# Patient Record
Sex: Female | Born: 1990 | Race: White | Hispanic: No | Marital: Single | Smoking: Never smoker
Health system: Southern US, Community
[De-identification: ages and names within clinical notes are randomized; demographics above are authoritative.]

## PROBLEM LIST (undated history)

## (undated) DIAGNOSIS — Q796 Ehlers-Danlos syndrome, unspecified: Secondary | ICD-10-CM

## (undated) DIAGNOSIS — K219 Gastro-esophageal reflux disease without esophagitis: Secondary | ICD-10-CM

## (undated) DIAGNOSIS — J939 Pneumothorax, unspecified: Secondary | ICD-10-CM

## (undated) DIAGNOSIS — N189 Chronic kidney disease, unspecified: Secondary | ICD-10-CM

## (undated) DIAGNOSIS — J9383 Other pneumothorax: Secondary | ICD-10-CM

## (undated) HISTORY — PX: RENAL BIOPSY, PERCUTANEOUS: SUR144

---

## 2011-07-28 DIAGNOSIS — Q7962 Hypermobile Ehlers-Danlos syndrome: Secondary | ICD-10-CM | POA: Insufficient documentation

## 2011-07-28 DIAGNOSIS — M249 Joint derangement, unspecified: Secondary | ICD-10-CM | POA: Insufficient documentation

## 2012-07-21 DIAGNOSIS — N032 Chronic nephritic syndrome with diffuse membranous glomerulonephritis: Secondary | ICD-10-CM | POA: Insufficient documentation

## 2012-09-06 ENCOUNTER — Emergency Department (HOSPITAL_COMMUNITY): Payer: BC Managed Care – PPO

## 2012-09-06 ENCOUNTER — Inpatient Hospital Stay (HOSPITAL_COMMUNITY): Payer: BC Managed Care – PPO

## 2012-09-06 ENCOUNTER — Encounter (HOSPITAL_COMMUNITY): Payer: Self-pay | Admitting: Emergency Medicine

## 2012-09-06 ENCOUNTER — Inpatient Hospital Stay (HOSPITAL_COMMUNITY)
Admission: EM | Admit: 2012-09-06 | Discharge: 2012-09-09 | DRG: 094 | Disposition: A | Discharge status: Home / Self Care | Payer: BC Managed Care – PPO | Attending: Cardiothoracic Surgery | Admitting: Cardiothoracic Surgery

## 2012-09-06 DIAGNOSIS — Q796 Ehlers-Danlos syndrome, unspecified: Secondary | ICD-10-CM

## 2012-09-06 DIAGNOSIS — J9383 Other pneumothorax: Principal | ICD-10-CM | POA: Diagnosis present

## 2012-09-06 DIAGNOSIS — J939 Pneumothorax, unspecified: Secondary | ICD-10-CM

## 2012-09-06 DIAGNOSIS — R809 Proteinuria, unspecified: Secondary | ICD-10-CM | POA: Diagnosis present

## 2012-09-06 DIAGNOSIS — N183 Chronic kidney disease, stage 3 unspecified: Secondary | ICD-10-CM | POA: Diagnosis present

## 2012-09-06 DIAGNOSIS — J9311 Primary spontaneous pneumothorax: Secondary | ICD-10-CM

## 2012-09-06 DIAGNOSIS — J9819 Other pulmonary collapse: Secondary | ICD-10-CM | POA: Diagnosis present

## 2012-09-06 HISTORY — DX: Pneumothorax, unspecified: J93.9

## 2012-09-06 HISTORY — PX: CHEST TUBE INSERTION: SHX231

## 2012-09-06 MED ORDER — HYDROCODONE-ACETAMINOPHEN 5-325 MG PO TABS
1.0000 | ORAL_TABLET | ORAL | Status: DC | PRN
  Administered 2012-09-06 – 2012-09-08 (×5): 1 via ORAL
  Filled 2012-09-06 (×5): qty 1

## 2012-09-06 MED ORDER — SORBITOL 70 % SOLN
30.0000 mL | Freq: Every day | Status: DC | PRN
  Filled 2012-09-06: qty 30

## 2012-09-06 MED ORDER — ACETAMINOPHEN 325 MG PO TABS: 650.0000 mg | ORAL_TABLET | Freq: Four times a day (QID) | ORAL | Status: DC | PRN

## 2012-09-06 MED ORDER — POLYETHYLENE GLYCOL 3350 17 G PO PACK
17.0000 g | PACK | Freq: Every day | ORAL | Status: DC
  Administered 2012-09-06 – 2012-09-08 (×3): 17 g via ORAL
  Filled 2012-09-06 (×5): qty 1

## 2012-09-06 MED ORDER — ONDANSETRON HCL 4 MG/2ML IJ SOLN: 4.0000 mg | Freq: Four times a day (QID) | INTRAMUSCULAR | Status: DC | PRN

## 2012-09-06 MED ORDER — ONDANSETRON HCL 4 MG PO TABS: 4.0000 mg | ORAL_TABLET | Freq: Four times a day (QID) | ORAL | Status: DC | PRN

## 2012-09-06 MED ORDER — THIAMINE HCL 100 MG/ML IJ SOLN
Freq: Once | INTRAVENOUS | Status: AC
  Administered 2012-09-06: 23:00:00 via INTRAVENOUS
  Filled 2012-09-06: qty 1000

## 2012-09-06 MED ORDER — FENTANYL CITRATE 0.05 MG/ML IJ SOLN
25.0000 ug | Freq: Once | INTRAMUSCULAR | Status: AC
  Administered 2012-09-06: 25 ug via INTRAVENOUS

## 2012-09-06 MED ORDER — ENALAPRIL MALEATE 2.5 MG PO TABS
2.5000 mg | ORAL_TABLET | Freq: Every day | ORAL | Status: DC
  Administered 2012-09-06 – 2012-09-08 (×3): 2.5 mg via ORAL
  Filled 2012-09-06 (×4): qty 1

## 2012-09-06 MED ORDER — MIDAZOLAM HCL 2 MG/2ML IJ SOLN
INTRAMUSCULAR | Status: AC
  Administered 2012-09-06: 2 mg
  Filled 2012-09-06: qty 4

## 2012-09-06 MED ORDER — SODIUM CHLORIDE 0.9 % IJ SOLN
3.0000 mL | Freq: Two times a day (BID) | INTRAMUSCULAR | Status: DC
  Administered 2012-09-07 – 2012-09-08 (×3): 3 mL via INTRAVENOUS

## 2012-09-06 MED ORDER — HYDROMORPHONE HCL PF 1 MG/ML IJ SOLN: 1.0000 mg | INTRAMUSCULAR | Status: DC | PRN

## 2012-09-06 MED ORDER — GUAIFENESIN-DM 100-10 MG/5ML PO SYRP: 5.0000 mL | ORAL_SOLUTION | ORAL | Status: DC | PRN

## 2012-09-06 MED ORDER — DOCUSATE SODIUM 100 MG PO CAPS
100.0000 mg | ORAL_CAPSULE | Freq: Two times a day (BID) | ORAL | Status: DC
  Administered 2012-09-08: 100 mg via ORAL
  Filled 2012-09-06 (×6): qty 1

## 2012-09-06 MED ORDER — ALUM & MAG HYDROXIDE-SIMETH 200-200-20 MG/5ML PO SUSP: 30.0000 mL | Freq: Four times a day (QID) | ORAL | Status: DC | PRN

## 2012-09-06 MED ORDER — RANITIDINE HCL 150 MG/10ML PO SYRP
300.0000 mg | ORAL_SOLUTION | Freq: Every day | ORAL | Status: DC
  Administered 2012-09-06: 300 mg via ORAL
  Filled 2012-09-06 (×2): qty 20

## 2012-09-06 MED ORDER — MIDAZOLAM HCL 5 MG/5ML IJ SOLN
4.0000 mg | Freq: Once | INTRAMUSCULAR | Status: AC
  Administered 2012-09-06: 1 mg via INTRAVENOUS

## 2012-09-06 MED ORDER — FENTANYL CITRATE 0.05 MG/ML IJ SOLN
50.0000 ug | Freq: Once | INTRAMUSCULAR | Status: AC
  Administered 2012-09-06: 25 ug via INTRAVENOUS
  Filled 2012-09-06: qty 2

## 2012-09-06 MED ORDER — POLYETHYLENE GLYCOL 3350 17 G PO PACK: 17.0000 g | PACK | Freq: Every day | ORAL | Status: DC

## 2012-09-06 MED ORDER — ASPIRIN EC 81 MG PO TBEC
81.0000 mg | DELAYED_RELEASE_TABLET | Freq: Every day | ORAL | Status: DC
  Administered 2012-09-07 – 2012-09-08 (×2): 81 mg via ORAL
  Filled 2012-09-06 (×3): qty 1

## 2012-09-06 MED ORDER — ACETAMINOPHEN 650 MG RE SUPP: 650.0000 mg | Freq: Four times a day (QID) | RECTAL | Status: DC | PRN

## 2012-09-06 NOTE — ED Notes (Signed)
Portable CXR complete. Dr. Maren Beach has reviewed it at bedside and says it's ok for patient to have something to drink.

## 2012-09-06 NOTE — ED Notes (Signed)
Patient transported to X-ray 

## 2012-09-06 NOTE — ED Notes (Signed)
Patient with 20 fr right chest tube placed by Dr. Maren Beach. Patient tolerated procedure well. Portable CXR ordered for verification of placement.

## 2012-09-06 NOTE — ED Notes (Signed)
TIME OUT PERFORMED AT 1805 for right chest tube placement by Dr. Maren Beach. Patient AAOx4, resp e/u, NAD. Family at bedside.

## 2012-09-06 NOTE — ED Provider Notes (Signed)
I saw this patient with Coral Ceo PA. She reports 40s, a sudden episode of right-sided chest shoulder and back pain yesterday when she got out of bed. His pain is persisted yesterday and today. She was seen in urgent care and then had a chest x-ray showing a pneumothorax. 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 I have a cal lto  the cardiothoracic surgery regarding management she is stable physical exam showed decreased breath sounds on the right side normal pressures normal pulses pulse ox 97% on room air.  Claudean Kinds, MD 09/10/12 1100

## 2012-09-06 NOTE — ED Provider Notes (Signed)
CSN: [id number]     Arrival date & time 09/06/12  1430 History     None    Chief Complaint  Patient presents with  . Cough    HPI  Denise Compton is a 22 year old female who presents to the ED for evaluation of a cough.  Patient states that she has had a cough for the past 2 weeks.  She states her cough was productive initially but has progressed into a dry cough.  She did not cough up any sputum initially and cannot comment on any quality of the sputum.  No hemoptysis.  She states that at 7:30 AM yesterday morning she developed pain on the front of her right chest and right scapula down her upper right back.  She currently has mild middle chest pain only with deep respirations.  She denies any chest pain at rest. She was at work today and became nauseated and had one episode of emesis. She denies any nausea currently.  She went to an urgent care ([facility]) this afternoon and was told she has a "collapsed lung" and was sent to the ED via EMS.  She denies any dyspnea or SOB currently.  She has no history of tobacco use.  She denies any trauma to the chest.  She also has had a sinus infection for the last few weeks, but her rhinorrhea and congestion have improved.  She has been taking sudafed and Claritin for her sinus infection and tramadol and tylenol for her chest pain.  She denies any fever, chills, change in appetite/activity, abdominal pain, headache, dizziness, or lightheadedness.  She denies any sick contacts.  No history of heart problems in the past.      History reviewed. No pertinent past medical history. History reviewed. No pertinent past surgical history. No family history on file. History  Substance Use Topics  . Smoking status: Not on file  . Smokeless tobacco: Not on file  . Alcohol Use: Not on file   OB History   Grav Para Term Preterm Abortions TAB SAB Ect Mult Living                 Review of Systems  Constitutional: Negative for fever, chills, diaphoresis, activity  change, appetite change and fatigue.  HENT: Positive for congestion (improved), rhinorrhea (improved) and sinus pressure (improved). Negative for ear pain, sore throat, neck pain, neck stiffness and voice change.   Eyes: Negative for visual disturbance.  Respiratory: Positive for cough. Negative for apnea, shortness of breath and wheezing.   Cardiovascular: Positive for chest pain (SEE HPI).  Gastrointestinal: Positive for nausea and vomiting. Negative for abdominal pain, diarrhea and constipation.  Genitourinary: Negative for dysuria.  Musculoskeletal: Negative for back pain.  Skin: Negative for rash and wound.  Neurological: Negative for dizziness, light-headedness and headaches.    Allergies  Review of patient's allergies indicates no known allergies.  Home Medications   Current Outpatient Rx  Name  Route  Sig  Dispense  Refill  . Acetaminophen (TYLENOL PO)   Oral   Take 2 capsules by mouth every 6 (six) hours as needed (headaches).         . enalapril (VASOTEC) 2.5 MG tablet   Oral   Take 2.5 mg by mouth at bedtime.         . fluticasone (FLONASE) 50 MCG/ACT nasal spray   Nasal   Place 1 spray into the nose 2 (two) times daily as needed (congestion).         Marland Kitchen  loratadine (CLARITIN) 10 MG tablet   Oral   Take 10 mg by mouth daily as needed for allergies.         . polyethylene glycol (MIRALAX / GLYCOLAX) packet   Oral   Take 17 g by mouth daily.         . pseudoephedrine (SUDAFED) 30 MG tablet   Oral   Take 60 mg by mouth every 4 (four) hours as needed for congestion.         . ranitidine (ZANTAC) 300 MG tablet   Oral   Take 300 mg by mouth at bedtime.         . traMADol (ULTRAM) 50 MG tablet   Oral   Take 50 mg by mouth every 4 (four) hours as needed for pain.         Marland Kitchen tretinoin (RETIN-A) 0.05 % cream   Topical   Apply topically at bedtime.         . triamcinolone cream (KENALOG) 0.1 %   Topical   Apply 1 application topically daily.           BP 149/98  Pulse 86  Temp(Src) 97.9 F (36.6 C) (Oral)  Resp 20  SpO2 100%  Filed Vitals:   09/06/12 1805 09/06/12 1810 09/06/12 1815 09/06/12 1845  BP: 158/106 150/99 159/101 144/111  Pulse: 94 88 107 85  Temp:      TempSrc:      Resp: 42 17 30 19   SpO2: 100% 100% 100% 100%    Physical Exam  Nursing note and vitals reviewed. Constitutional: She is oriented to person, place, and time. She appears well-developed and well-nourished. No distress.  HENT:  Head: Normocephalic and atraumatic.  Right Ear: External ear normal.  Left Ear: External ear normal.  Nose: Nose normal.  Mouth/Throat: Oropharynx is clear and moist. No oropharyngeal exudate.  Eyes: Conjunctivae are normal. Pupils are equal, round, and reactive to light. Right eye exhibits no discharge. Left eye exhibits no discharge.  Neck: Normal range of motion. Neck supple.  Cardiovascular: Normal rate, regular rhythm, normal heart sounds and intact distal pulses.  Exam reveals no gallop and no friction rub.   No murmur heard. Pulmonary/Chest: Effort normal. No respiratory distress. She has no wheezes. She has no rales. She exhibits no tenderness.  Decreased breath sounds on the right throughout   Abdominal: Soft. Bowel sounds are normal. She exhibits no distension and no mass. There is no tenderness. There is no rebound and no guarding.  Musculoskeletal: Normal range of motion. She exhibits no edema and no tenderness.  Neurological: She is alert and oriented to person, place, and time.  Skin: Skin is warm and dry. She is not diaphoretic.    ED Course   Procedures (including critical care time)  Labs Reviewed  CBC  BASIC METABOLIC PANEL   No results found. No diagnosis found.  No results found for this or any previous visit.   DG Chest Portable 1 View (Final result)  Result time: 09/06/12 18:58:16    Final result by Rad Results In Interface (09/06/12 18:58:16)    Narrative:   *RADIOLOGY  REPORT*  Clinical Data: right-sided chest tube. Right pneumothorax.  PORTABLE CHEST - 1 VIEW  Comparison: 09/06/2012.  Findings: Interval placement of right thoracostomy tube. Nearly all of the right pneumothorax has been evacuated. A pleural line is still visible at the apex. Subsegmental atelectasis at the right lung base. The cardiopericardial silhouette appears within normal limits. The left lung  appears clear.  IMPRESSION: Interval placement of right thoracostomy tube with near complete evacuation of the right pneumothorax.   Original Report Authenticated By: Andreas Newport, M.D.             DG Chest 2 View (Final result)  Result time: 09/06/12 15:35:36    Final result by Rad Results In Interface (09/06/12 15:35:36)    Narrative:   *RADIOLOGY REPORT*  Clinical Data: Chest pain, cough  CHEST - 2 VIEW  Comparison: None  Findings: Normal cardiac and mediastinal contours. No consolidative pulmonary opacities. There is a 1 cm nodular density projecting over the right mid hemithorax. There is a large right pneumothorax with mild leftward shift of the mediastinum. Regional skeleton is grossly unremarkable.  IMPRESSION: 1. Large right pneumothorax with mild leftward mediastinal shift.  2. There is a 1 cm nodular density projecting over the mid right hemithorax which may represent nipple shadow. Recommend attention on follow-up imaging with nipple markers in place.  Critical Value/emergent results were called by telephone at the time of interpretation on 09/06/2012 at 330 pm to Dr. Judd Lien, who verbally acknowledged these results.   Original Report Authenticated By: Annia Belt, M.D    MDM  Nikko Quast is a 22 year old female who presents to the ED for evaluation of a cough.  Chest x-ray ordered to further evaluate.  Patient declined pain medications at this time.     Rechecks  3:55 PM = Patient stable doing well 5:11 PM = Patient states she is doing well.   Pulse ox 99%.  Patient stable.   7:20 PM = Patient resting comfortably. Doing well post-procedure.  No questions or concerns.     Consults  4:55 PM = Spoke with cardiothoracic surgery Dr. Maren Beach.  He is coming to insert the chest tube.   5:57 PM = Cardiothoracic surgeon in the room consenting the patient.  Will perform procedure and she will be admitted under his service.     Etiology of symptoms likely due to a pneumothorax.  A chest tube was inserted by Dr. Maren Beach with cardiothoracic surgery in the ED.  Patient remained stable throughout her ED visit.  She will be admitted as an inpatient under cardiothoracic surgery.  Patient was in agreement with admission and plan.    Final impressions: 1. Pneumothorax     Luiz Iron PA-C   This patient was discussed with Dr. Rolland Porter   Jillyn Ledger, PA-C 09/06/12 1928

## 2012-09-06 NOTE — Progress Notes (Signed)
Procedure Note:  Dx- R spont pneumothorax Procedure- R 4F chest tube placed under local, IV sedation Surgeon- Zenaida Niece Trigt Disposition- Admit to unit 2000 Comp- none known

## 2012-09-06 NOTE — H&P (Signed)
[address]       [place] [address]             [phone]        [facility] Medical Record #[id number] Date of Birth: 02-Mar-1990  Referring: No ref. provider found Primary Care: No primary provider on file.  Chief Complaint:    Chief Complaint  Patient presents with  . Cough    patient examined, chest x-ray reviewed in the emergency department    History of Present Illness:     22 year old female nonsmoker presented to the hospital 24 hours a right sided chest pain and cough and was found to have a right 40% spontaneous pneumothorax--first occurrence. No history of trauma heavy lifting  violent coughing or fall. 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 She states she had a 2-D echocardiogram previously which showed no evidence of cardiac or aortic disease. She has stage III chronic kidney failure  Current pneumothorax is temporarily related to her menstrual cycle but previous menstrual cycles have had no similar symptoms..   Current Activity/ Functional Status: Works full-time as an Airline pilot and Tolerates normal activities   Zubrod Score: At the time of surgery this patient's most appropriate activity status/level should be described as: []  Normal activity, no symptoms [x]  Symptoms, fully ambulatory []  Symptoms, in bed less than or equal to 50% of the time []  Symptoms, in bed greater than 50% of the time but less than 100% []  Bedridden []  Moribund  History reviewed. No pertinent past medical history.  History reviewed. No pertinent past surgical history.  History  Smoking status  . Not on file  Smokeless tobacco  . Not on file   History  Alcohol Use: Not on file    History   Social History  . Marital Status: Single    Spouse Name: N/A    Number of Children: N/A  .  Years of Education: N/A   Occupational History  . Not on file.   Social History Main Topics  . Smoking status: Not on file  . Smokeless tobacco: Not on file  . Alcohol Use: Not on file  . Drug Use: Not on file  . Sexually Active: Not on file   Other Topics Concern  . Not on file   Social History Narrative  . No narrative on file    No Known Allergies  No current facility-administered medications for this encounter.   Current Outpatient Prescriptions  Medication Sig Dispense Refill  . Acetaminophen (TYLENOL PO) Take 2 capsules by mouth every 6 (six) hours as needed (headaches).      . enalapril (VASOTEC) 2.5 MG tablet Take 2.5 mg by mouth at bedtime.      . fluticasone (FLONASE) 50 MCG/ACT nasal spray Place 1 spray into the nose 2 (two) times daily as needed (congestion).      Marland Kitchen loratadine (CLARITIN) 10 MG tablet Take 10 mg by mouth daily as needed for allergies.      . polyethylene glycol (MIRALAX / GLYCOLAX) packet Take 17 g by mouth daily.      . pseudoephedrine (SUDAFED) 30 MG tablet Take 60 mg by mouth every 4 (four) hours as needed for congestion.      . ranitidine (ZANTAC) 300 MG tablet Take 300 mg by mouth at bedtime.      Marland Kitchen  traMADol (ULTRAM) 50 MG tablet Take 50 mg by mouth every 4 (four) hours as needed for pain.      Marland Kitchen tretinoin (RETIN-A) 0.05 % cream Apply topically at bedtime.      . triamcinolone cream (KENALOG) 0.1 % Apply 1 application topically daily.         (Not in a hospital admission)  History reviewed. No pertinent family history.   Review of Systems:     Cardiac Review of Systems: Y or N  Chest Pain [ yes   ]  Resting SOB [yes yes   ] Exertional SOB  [  ]  Orthopnea [  ]   Pedal Edema [   ]    Palpitations [no no  ] Syncope  [ no ]   Presyncope [ no  ]  General Review of Systems: [Y] = yes [  ]=no Constitional: recent weight change [  ]; anorexia [  ]; fatigue [  ]; nausea [  ]; night sweats [  ]; fever [  ]; or chills [  ]                                                                Dental: poor dentition[  ]; Last Dentist visit:   Eye : blurred vision [  ]; diplopia [   ]; vision changes [  ];  Amaurosis fugax[  ]; Resp: cough [  ];  wheezing[  ];  hemoptysis[  ]; shortness of breath[  ]; paroxysmal nocturnal dyspnea[  ]; dyspnea on exertion[  ]; or orthopnea[  ];  GI:  gallstones[  ], vomiting[  ];  dysphagia[  ]; melena[  ];  hematochezia [  ]; heartburn[  ];   Hx of  Colonoscopy[  ]; GU: kidney stones [  ]; hematuria[  ];   dysuria [  ];  nocturia[  ];  history of     obstruction [  ]; urinary frequency [  ]             Skin: rash, swelling[  ];, hair loss[  ];  peripheral edema[  ];  or itching[  ]; Musculosketetal: myalgias[  ];  joint swelling[  ];  joint erythema[  ];  joint pain[  ];  back pain[  ];  Heme/Lymph: bruising[  ];  bleeding[  ];  anemia[  ];  Neuro: TIA[  ];  headaches[  ];  stroke[  ];  vertigo[  ];  seizures[  ];   paresthesias[  ];  difficulty walking[  ];  Psych:depression[  ]; anxiety[  ];  Endocrine: diabetes[  ];  thyroid dysfunction[  ];  Immunizations: Flu [  ]; Pneumococcal[  ];  Other:  Physical Exam: BP 159/101  Pulse 107  Temp(Src) 97.9 F (36.6 C) (Oral)  Resp 30  SpO2 100%  LMP 09/06/2012  General appearance 22 year old thin Caucasian female no acute distress- HEENT-normocephalic no JVD no crepitus in the neck Thorax-no deformity no tenderness breath sounds diminished right base Cardiac regular rhythm no murmur or gallop Abdomen soft nontender no organomegaly Extremities warm pink and well perfused nontender no edema Vascular palpable pulses in all extremities Neurologic no focal motor deficit appropriate and responsive--   Diagnostic Studies & Laboratory data:  chest x-ray reviewed showing large right pneumothorax   Recent Radiology Findings:   Dg Chest 2 View  09/06/2012   *RADIOLOGY REPORT*  Clinical Data: Chest pain, cough  CHEST - 2 VIEW  Comparison: None  Findings: Normal  cardiac and mediastinal contours.  No consolidative pulmonary opacities.  There is a 1 cm nodular density projecting over the right mid hemithorax.  There is a large right pneumothorax with mild leftward shift of the mediastinum.  Regional skeleton is grossly unremarkable.  IMPRESSION: 1. Large right pneumothorax with mild leftward mediastinal shift.  2.  There is a 1 cm nodular density projecting over the mid right hemithorax which may represent nipple shadow.  Recommend attention on follow-up imaging with nipple markers in place.  Critical Value/emergent results were called by telephone at the time of interpretation on  09/06/2012  at 330 pm to Dr. Judd Lien, who verbally acknowledged these results.   Original Report Authenticated By: Annia Belt, M.D      Recent Lab Findings: No results found for this basename: WBC, HGB, HCT, PLT, GLUCOSE, CHOL, TRIG, HDL, LDLDIRECT, LDLCALC, ALT, AST, NA, K, CL, CREATININE, BUN, CO2, TSH, INR, GLUF, HGBA1C      Assessment / Plan:     Spontaneous right pneumothorax-40%  20 French chest tube placed in EGD under local percent lidocaine anesthesia and IV conscious monitored sedation.  Post chest tube x-ray shows reexpansion of the lung the tube in good position, minimal airleak N. Pleur-evac   Plan  Admit to unit 2000 for chest tube therapy. If the air leak persists tomorrow will get chest CT without contrast       @ME1 @ 09/06/2012 6:43 PM

## 2012-09-06 NOTE — ED Notes (Signed)
Pt brought to ED by EMS with complains of cough and also pain in her rt side of the lung.

## 2012-09-07 ENCOUNTER — Inpatient Hospital Stay (HOSPITAL_COMMUNITY): Payer: BC Managed Care – PPO

## 2012-09-07 LAB — CBC
HCT: 35.9 % — ABNORMAL LOW (ref 36.0–46.0)
Hemoglobin: 12.7 g/dL (ref 12.0–15.0)
MCH: 30.8 pg (ref 26.0–34.0)
MCHC: 35.4 g/dL (ref 30.0–36.0)
MCV: 87.1 fL (ref 78.0–100.0)
Platelets: 255 10*3/uL (ref 150–400)
RBC: 4.12 MIL/uL (ref 3.87–5.11)
RDW: 12.6 % (ref 11.5–15.5)
WBC: 10.4 10*3/uL (ref 4.0–10.5)

## 2012-09-07 LAB — URINALYSIS, ROUTINE W REFLEX MICROSCOPIC
Bilirubin Urine: NEGATIVE
Glucose, UA: NEGATIVE mg/dL
Ketones, ur: NEGATIVE mg/dL
Leukocytes, UA: NEGATIVE
Nitrite: NEGATIVE
Protein, ur: >300 mg/dL — AB
Specific Gravity, Urine: 1.019 (ref 1.005–1.030)
Urobilinogen, UA: 0.2 mg/dL (ref 0.0–1.0)
pH: 5.5 (ref 5.0–8.0)

## 2012-09-07 LAB — URINE MICROSCOPIC-ADD ON

## 2012-09-07 LAB — BASIC METABOLIC PANEL
BUN: 21 mg/dL (ref 6–23)
CO2: 27 mEq/L (ref 19–32)
Calcium: 8.8 mg/dL (ref 8.4–10.5)
Chloride: 105 mEq/L (ref 96–112)
Creatinine, Ser: 1.58 mg/dL — ABNORMAL HIGH (ref 0.50–1.10)
GFR calc Af Amer: 52 mL/min — ABNORMAL LOW (ref >90)
GFR calc non Af Amer: 45 mL/min — ABNORMAL LOW (ref >90)
Glucose, Bld: 93 mg/dL (ref 70–99)
Potassium: 4.6 mEq/L (ref 3.5–5.1)
Sodium: 139 mEq/L (ref 135–145)

## 2012-09-07 MED ORDER — PANTOPRAZOLE SODIUM 40 MG PO TBEC
40.0000 mg | DELAYED_RELEASE_TABLET | Freq: Every day | ORAL | Status: DC
  Administered 2012-09-07 – 2012-09-08 (×2): 40 mg via ORAL
  Filled 2012-09-07 (×2): qty 1

## 2012-09-07 NOTE — Care Management Note (Unsigned)
    Page 1 of 1   09/07/2012     4:45:47 PM   CARE MANAGEMENT NOTE 09/07/2012  Patient:  Denise Compton, Denise Compton   Account Number:  [id number]  Date Initiated:  09/07/2012  Documentation initiated by:  Kahlan Engebretson  Subjective/Objective Assessment:   PT ADM ON 09/06/12 WITH RT PNEUMOTHORAX.  PTA, PT INDEPENDENT, LIVES WITH MOTHER.     Action/Plan:   WILL FOLLOW FOR DISCHARGE NEEDS AS PT PROGRESSES.   Anticipated DC Date:  09/09/2012   Anticipated DC Plan:  HOME/SELF CARE      DC Planning Services  CM consult      Choice offered to / List presented to:             Status of service:  In process, will continue to follow Medicare Important Message given?   (If response is "NO", the following Medicare IM given date fields will be blank) Date Medicare IM given:   Date Additional Medicare IM given:    Discharge Disposition:    Per UR Regulation:  Reviewed for med. necessity/level of care/duration of stay  If discussed at Long Length of Stay Meetings, dates discussed:    Comments:

## 2012-09-07 NOTE — Progress Notes (Signed)
Utilization Review Completed.Kaladin Noseworthy T8/12/2012  

## 2012-09-07 NOTE — Progress Notes (Addendum)
      [address]       [name] [address]             [phone]            Subjective: Patient without complaints  Objective: Vital signs in last 24 hours: Temp:  [97.9 F (36.6 C)-99.2 F (37.3 C)] 98 F (36.7 C) (08/12 0455) Pulse Rate:  [70-107] 71 (08/12 0455) Cardiac Rhythm:  [-] Normal sinus rhythm (08/11 2030) Resp:  [14-42] 16 (08/12 0455) BP: (128-159)/(77-111) 128/77 mmHg (08/12 0455) SpO2:  [99 %-100 %] 99 % (08/12 0455) Weight:  [53.9 kg (118 lb 13.3 oz)] 53.9 kg (118 lb 13.3 oz) (08/11 2038)     Intake/Output from previous day: 08/11 0701 - 08/12 0700 In: 480 [P.O.:480] Out: -    Physical Exam:  Cardiovascular: RRR Pulmonary: Clear to auscultation bilaterally; no rales, wheezes, or rhonchi. Abdomen: Soft, non tender, bowel sounds present. Extremities: No  lower extremity edema. Wounds: Dressing is clean and dry. Chest Tube: to suction, no air leak  Lab Results: CBC: Recent Labs  09/07/12 0548  WBC 10.4  HGB 12.7  HCT 35.9*  PLT 255   BMET:  Recent Labs  09/07/12 0548  NA 139  K 4.6  CL 105  CO2 27  GLUCOSE 93  BUN 21  CREATININE 1.58*  CALCIUM 8.8    PT/INR: No results found for this basename: LABPROT, INR,  in the last 72 hours ABG:  INR: Will add last result for INR, ABG once components are confirmed Will add last 4 CBG results once components are confirmed  Assessment/Plan:  1. CV - SR 2.  Pulmonary - Chest tube with scant output. There is no air leak. Will likely place to water seal. Check CXR in am   ZIMMERMAN,DONIELLE MPA-C 09/07/2012,7:48 AM  Leave suction until late today Creat 1.6, protein +++ in urine c/w recent dx glomerulonephritis F/u bmet in am and cont IV fluids

## 2012-09-07 NOTE — Progress Notes (Signed)
Brief Nutrition Note:   RD pulled to pt for unintentional weight loss and underweight BMI.  Spoke with pt, recently was on a trip to [place] and did not eat well. Since returning pt appetite has been normal and intake adequate.   Chart reviewed, no nutrition interventions warranted at this time. Please consult as needed.   Clarene Duke RD, LDN Pager [phone] After Hours pager [phone]

## 2012-09-07 NOTE — Op Note (Signed)
NAMESAM, WUNSCHEL               ACCOUNT NO.:  [id number]  MEDICAL RECORD NO.:  [id number]  LOCATION:  2W31C                        FACILITY:  [facility]  PHYSICIAN:  Kerin Perna, M.D.  DATE OF BIRTH:  [birth year]  DATE OF PROCEDURE:  09/06/2012 DATE OF DISCHARGE:                              OPERATIVE REPORT   OPERATION:  Right chest tube placement, 20-French.  PREOPERATIVE DIAGNOSIS:  40% right spontaneous pneumothorax.  POSTOPERATIVE DIAGNOSIS:  40% right spontaneous pneumothorax.  SURGEON:  Kerin Perna, M.D.  ANESTHESIA:  Local 1% lidocaine with IV conscious sedation.  CLINICAL NOTE:  The patient is 22 years old and presented to the emergency department with her first right-sided spontaneous pneumothorax, which was symptomatic with pain and cough for 24 hours. She had no history of trauma or other significant history.  A right posterior kidney biopsy was performed approximately 1 month ago at [facility] for diagnosis of chronic glomerulonephritis.  Chest tube placement was recommended, and I discussed the procedure in detail with both the patient and her mother, and informed consent was obtained.  The alternatives and expected recovery after chest tube placement were discussed as well as potential risks.  OPERATIVE PROCEDURE:  The patient was supine in the emergency department bed, and a proper time-out was performed.  The anterior right chest was prepped and draped as a sterile field.  Lidocaine 1% was infiltrated over the anterior 3rd rib.  A small 1-inch incision was made and further lidocaine was infiltrated down to the intercostal muscles.  A hemostat was used to enter the right pleural space with evacuation of air.  A 20- French chest tube was then inserted into the right pleural space, connected to an underwater seal Pleur-Evac system and secured to the skin with a silk suture.  A sterile dressing was applied.  The Pleur- Evac was placed to  suction.  A sterile dressing was applied.  The patient was given IV fentanyl and Versed while being fully monitored during procedure.  A followup chest x-ray was performed showing resolution of the spontaneous pneumothorax with good chest tube position.     Kerin Perna, M.D.     PV/MEDQ  D:  09/06/2012  T:  09/07/2012  Job:  [id number]

## 2012-09-08 ENCOUNTER — Inpatient Hospital Stay (HOSPITAL_COMMUNITY): Payer: BC Managed Care – PPO

## 2012-09-08 LAB — BASIC METABOLIC PANEL
BUN: 20 mg/dL (ref 6–23)
CO2: 27 mEq/L (ref 19–32)
GFR calc non Af Amer: 52 mL/min — ABNORMAL LOW (ref >90)
Glucose, Bld: 93 mg/dL (ref 70–99)
Potassium: 3.9 mEq/L (ref 3.5–5.1)
Sodium: 138 mEq/L (ref 135–145)

## 2012-09-08 MED ORDER — TRAMADOL HCL 50 MG PO TABS: 50.0000 mg | ORAL_TABLET | ORAL | Status: DC | PRN

## 2012-09-08 MED ORDER — POLYETHYLENE GLYCOL 3350 17 G PO PACK: 17.0000 g | PACK | Freq: Every day | ORAL | Status: DC

## 2012-09-08 NOTE — Progress Notes (Signed)
CT pulled per order. Pt tolerated procedure well. Occlusive dsg applied and sutures tightly tied.  Pt instructed to inform RN if SOB occurs. CXR to be obtained in AM.  Will continue to monitor pt closely.

## 2012-09-08 NOTE — Discharge Summary (Signed)
Physician Discharge Summary       [address]       [name] [address]             [phone]    Patient ID: Denise Compton MRN: [id number] DOB/AGE: 1990/07/05 22 y.o.  Admit date: 09/06/2012 Discharge date: 09/09/2012  Admission Diagnoses: 1.Spontaneous right pneumothorax 2.History of Ehlers-Danlos syndrome 3.History of stage III CKD  Discharge Diagnoses:  1.Spontaneous right pneumothorax 2.History of Ehlers-Danlos syndrome 3.History of stage III CKD  Procedure (s):  Right chest tube placement, 20-French by Dr. Donata Clay on 09/06/2012.  History of Presenting Illness: This is a 22 year old female nonsmoker who presented to [facility] with complaints of right sided chest pain and cough. Chest x ray showed a right 40% spontaneous pneumothorax. This was her first occurrence. She denied any history of trauma, heavy lifting, violent coughing or fall. 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 She was placed on the enalapril 2.5 mg daily. She states she had a 2-D echocardiogram previously which showed no evidence of cardiac or aortic disease. In addition, she has stage III chronic kidney failure. Current pneumothorax is temporarily related to her menstrual cycle, but previous menstrual cycles have had no similar symptoms. Dr. Donata Clay placed a right chest tube. Follow up chest xray showed re expansion of the right lung.  Brief Hospital Course:  She has remained afebrile and hemodynamically stable. Her chest tube had scant output, no air leak, and chest x rays remained stable. Chest tube was placed to water seal on 8/12. Chest x ray this morning showed a stable, trace right apical pneumothorax. Chest tube will be removed. Provided she remains afebrile, chest x ray remains stable, and pending morning round evaluation, she will be surgically stable for discharge  on 09/09/2012.   Latest Vital Signs: Blood pressure 115/70, pulse 65, temperature 98.4 F (36.9 C), temperature source Oral, resp. rate 17, height 5\' 9"  (1.753 m), weight 53.797 kg (118 lb 9.6 oz), last menstrual period 09/05/2012, SpO2 97.00%.  Physical Exam: Cardiovascular: RRR  Pulmonary: Clear to auscultation bilaterally; no rales, wheezes, or rhonchi.  Abdomen: Soft, non tender, bowel sounds present.  Extremities: No lower extremity edema.  Wound: Dressing is clean and dry.     Discharge Condition:Stable  Recent laboratory studies:  Lab Results  Component Value Date   WBC 10.4 09/07/2012   HGB 12.7 09/07/2012   HCT 35.9* 09/07/2012   MCV 87.1 09/07/2012   PLT 255 09/07/2012   Lab Results  Component Value Date   NA 138 09/08/2012   K 3.9 09/08/2012   CL 104 09/08/2012   CO2 27 09/08/2012   CREATININE 1.41* 09/08/2012   GLUCOSE 93 09/08/2012      Diagnostic Studies: Dg Chest 2 View  09/06/2012   *RADIOLOGY REPORT*  Clinical Data: Chest pain, cough  CHEST - 2 VIEW  Comparison: None  Findings: Normal cardiac and mediastinal contours.  No consolidative pulmonary opacities.  There is a 1 cm nodular density projecting over the right mid hemithorax.  There is a large right pneumothorax with mild leftward shift of the mediastinum.  Regional skeleton is grossly unremarkable.  IMPRESSION: 1. Large right pneumothorax with mild leftward mediastinal shift.  2.  There is a 1 cm nodular density projecting over the mid right hemithorax which may represent nipple shadow.  Recommend attention on follow-up imaging with nipple markers in  place.  Critical Value/emergent results were called by telephone at the time of interpretation on  09/06/2012  at 330 pm to Dr. Judd Lien, who verbally acknowledged these results.   Original Report Authenticated By: Annia Belt, M.D   09/09/2012 CHEST - 2 VIEW  Comparison: September 08, 2012.  Findings: Cardiomediastinal silhouette appears normal. There has  been interval  removal of right-sided chest tube. No definite  pneumothorax is seen. No acute pulmonary disease is noted.  Hyperexpansion of the lungs is noted.  IMPRESSION:  No pneumothorax seen.  Original Report Authenticated By: Lupita Raider., M.D.        Future Appointments Provider Department Dept Phone   09/22/2012 12:00 PM Kerin Perna, MD [facility] [phone]     Discharge Medications:   Medication List         enalapril 2.5 MG tablet  Commonly known as:  VASOTEC  Take 2.5 mg by mouth at bedtime.     fluticasone 50 MCG/ACT nasal spray  Commonly known as:  FLONASE  Place 1 spray into the nose 2 (two) times daily as needed (congestion).     loratadine 10 MG tablet  Commonly known as:  CLARITIN  Take 10 mg by mouth daily as needed for allergies.     polyethylene glycol packet  Commonly known as:  MIRALAX / GLYCOLAX  Take 17 g by mouth daily.     pseudoephedrine 30 MG tablet  Commonly known as:  SUDAFED  Take 60 mg by mouth every 4 (four) hours as needed for congestion.     ranitidine 300 MG tablet  Commonly known as:  ZANTAC  Take 300 mg by mouth at bedtime.     traMADol 50 MG tablet  Commonly known as:  ULTRAM  Take 1 tablet (50 mg total) by mouth every 4 (four) hours as needed for pain.     tretinoin 0.05 % cream  Commonly known as:  RETIN-A  Apply topically at bedtime.     triamcinolone cream 0.1 %  Commonly known as:  KENALOG  Apply 1 application topically daily.     TYLENOL PO  Take 2 capsules by mouth every 6 (six) hours as needed (headaches).        Follow Up Appointments: Follow-up Information   Follow up with VAN Dinah Beers, MD. (PA/LAT CXR to be taken (at [facility] which is in the same building as Dr. Zenaida Niece Trigt's office) on 09/22/2012 at 11:00 am;Appointment with Dr. Donata Clay is on 09/22/2012 at 12:00 pm)    Specialty:  Cardiothoracic Surgery   Contact information:   [address] [place] [address] [phone]       Signed: Doree Fudge MPA-C 09/09/2012, 8:18 AM

## 2012-09-08 NOTE — Progress Notes (Addendum)
      [address]       [name] [address]             [phone]            Subjective: Patient without complaints  Objective: Vital signs in last 24 hours: Temp:  [97.5 F (36.4 C)-98.5 F (36.9 C)] 97.5 F (36.4 C) (08/13 0548) Pulse Rate:  [61-79] 61 (08/13 0548) Cardiac Rhythm:  [-] Normal sinus rhythm (08/13 0808) Resp:  [18] 18 (08/13 0548) BP: (107-132)/(73-83) 107/73 mmHg (08/13 0548) SpO2:  [100 %] 100 % (08/13 0548) Weight:  [53.4 kg (117 lb 11.6 oz)] 53.4 kg (117 lb 11.6 oz) (08/13 0548)     Intake/Output from previous day: 08/12 0701 - 08/13 0700 In: 720 [P.O.:720] Out: 1060 [Urine:1050; Drains:10]   Physical Exam:  Cardiovascular: RRR Pulmonary: Clear to auscultation bilaterally; no rales, wheezes, or rhonchi. Abdomen: Soft, non tender, bowel sounds present. Extremities: No  lower extremity edema. Wounds: Dressing is clean and dry. Chest Tube: to water seal, no air leak  Lab Results: CBC:  Recent Labs  09/07/12 0548  WBC 10.4  HGB 12.7  HCT 35.9*  PLT 255   BMET:   Recent Labs  09/07/12 0548 09/08/12 0445  NA 139 138  K 4.6 3.9  CL 105 104  CO2 27 27  GLUCOSE 93 93  BUN 21 20  CREATININE 1.58* 1.41*  CALCIUM 8.8 8.8    PT/INR: No results found for this basename: LABPROT, INR,  in the last 72 hours ABG:  INR: Will add last result for INR, ABG once components are confirmed Will add last 4 CBG results once components are confirmed  Assessment/Plan:  1. CV - SR 2.  Pulmonary - Chest tube with scant output. Chest tube is to water seal and there is no air leak.  CXR shows trace, stable right apical pneumothorax. Likely remove chest tube today. Check CXR in am 3.Large amount of protein in urine. Creatinine now down to 1.41. 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 4.Likely discharge in am, if CXR stable  ZIMMERMAN,DONIELLE MPA-C 09/08/2012,8:11 AM  Remove  chest tube 2 view chest x-ray in a.m. patient examined and medical record reviewed,agree with above note. VAN TRIGT III,Malessa Zartman 09/08/2012

## 2012-09-09 ENCOUNTER — Inpatient Hospital Stay (HOSPITAL_COMMUNITY): Payer: BC Managed Care – PPO

## 2012-09-09 NOTE — Progress Notes (Signed)
Discharged to home with family office visits in place teaching done  

## 2012-09-09 NOTE — Progress Notes (Addendum)
      [address]       [name] [address]             [phone]            Subjective: Patient without complaints-eating breakfast.  Objective: Vital signs in last 24 hours: Temp:  [98.2 F (36.8 C)-98.5 F (36.9 C)] 98.4 F (36.9 C) (08/14 0418) Pulse Rate:  [65-73] 65 (08/14 0418) Cardiac Rhythm:  [-] Normal sinus rhythm (08/13 2010) Resp:  [16-18] 17 (08/14 0418) BP: (115-123)/(70-81) 115/70 mmHg (08/14 0418) SpO2:  [97 %-100 %] 97 % (08/14 0418) Weight:  [53.797 kg (118 lb 9.6 oz)] 53.797 kg (118 lb 9.6 oz) (08/14 0418)     Intake/Output from previous day: 08/13 0701 - 08/14 0700 In: 723 [P.O.:720; I.V.:3] Out: 1000 [Urine:1000]   Physical Exam:  Cardiovascular: RRR Pulmonary: Clear to auscultation bilaterally; no rales, wheezes, or rhonchi. Abdomen: Soft, non tender, bowel sounds present. Extremities: No  lower extremity edema. Wounds: Dressing is clean and dry. Chest Tubeout  Lab Results: CBC:  Recent Labs  09/07/12 0548  WBC 10.4  HGB 12.7  HCT 35.9*  PLT 255   BMET:   Recent Labs  09/07/12 0548 09/08/12 0445  NA 139 138  K 4.6 3.9  CL 105 104  CO2 27 27  GLUCOSE 93 93  BUN 21 20  CREATININE 1.58* 1.41*  CALCIUM 8.8 8.8    PT/INR: No results found for this basename: LABPROT, INR,  in the last 72 hours ABG:  INR: Will add last result for INR, ABG once components are confirmed Will add last 4 CBG results once components are confirmed  Assessment/Plan:  1. CV - SR 2.  Pulmonary - Chest tube removed yesterday.  CXR shows probable trace, stable right apical pneumothorax.  3.Large amount of protein in urine. Creatinine now down to 1.41. 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 4.Discharge  ZIMMERMAN,DONIELLE MPA-C 09/09/2012,7:31 AM   Agree with plan to DC CXR and patient examined Office next WED with CXR

## 2012-09-09 NOTE — Discharge Summary (Signed)
patient examined and medical record reviewed,agree with above note. VAN TRIGT III,Khamani Fairley 09/09/2012   

## 2012-09-10 NOTE — ED Provider Notes (Signed)
Medical screening examination/treatment/procedure(s) were performed by non-physician practitioner and as supervising physician I was immediately available for consultation/collaboration.   Jocie Meroney Joseph Burney Calzadilla, MD 09/10/12 1105 

## 2012-09-12 ENCOUNTER — Encounter (HOSPITAL_COMMUNITY): Payer: Self-pay | Admitting: *Deleted

## 2012-09-12 ENCOUNTER — Encounter (HOSPITAL_COMMUNITY)
Admission: EM | Disposition: A | Discharge status: Home / Self Care | Payer: Self-pay | Source: Home / Self Care | Attending: Thoracic Surgery (Cardiothoracic Vascular Surgery)

## 2012-09-12 ENCOUNTER — Inpatient Hospital Stay (HOSPITAL_COMMUNITY): Payer: BC Managed Care – PPO

## 2012-09-12 ENCOUNTER — Inpatient Hospital Stay (HOSPITAL_COMMUNITY)
Admission: EM | Admit: 2012-09-12 | Discharge: 2012-09-16 | DRG: 075 | Disposition: A | Discharge status: Home / Self Care | Payer: BC Managed Care – PPO | Attending: Thoracic Surgery (Cardiothoracic Vascular Surgery) | Admitting: Thoracic Surgery (Cardiothoracic Vascular Surgery)

## 2012-09-12 ENCOUNTER — Emergency Department (HOSPITAL_COMMUNITY): Payer: BC Managed Care – PPO

## 2012-09-12 ENCOUNTER — Encounter (HOSPITAL_COMMUNITY): Payer: Self-pay | Admitting: Anesthesiology

## 2012-09-12 ENCOUNTER — Emergency Department (HOSPITAL_COMMUNITY): Payer: BC Managed Care – PPO | Admitting: Anesthesiology

## 2012-09-12 DIAGNOSIS — Z79899 Other long term (current) drug therapy: Secondary | ICD-10-CM

## 2012-09-12 DIAGNOSIS — N189 Chronic kidney disease, unspecified: Secondary | ICD-10-CM | POA: Insufficient documentation

## 2012-09-12 DIAGNOSIS — K219 Gastro-esophageal reflux disease without esophagitis: Secondary | ICD-10-CM

## 2012-09-12 DIAGNOSIS — J930 Spontaneous tension pneumothorax: Secondary | ICD-10-CM

## 2012-09-12 DIAGNOSIS — Q796 Ehlers-Danlos syndrome, unspecified: Secondary | ICD-10-CM

## 2012-09-12 DIAGNOSIS — J939 Pneumothorax, unspecified: Secondary | ICD-10-CM

## 2012-09-12 DIAGNOSIS — J9383 Other pneumothorax: Secondary | ICD-10-CM

## 2012-09-12 HISTORY — DX: Chronic kidney disease, unspecified: N18.9

## 2012-09-12 HISTORY — DX: Ehlers-Danlos syndrome, unspecified: Q79.60

## 2012-09-12 HISTORY — DX: Other pneumothorax: J93.83

## 2012-09-12 HISTORY — DX: Pneumothorax, unspecified: J93.9

## 2012-09-12 HISTORY — DX: Gastro-esophageal reflux disease without esophagitis: K21.9

## 2012-09-12 HISTORY — PX: VIDEO ASSISTED THORACOSCOPY: SHX5073

## 2012-09-12 LAB — TYPE AND SCREEN
ABO/RH(D): A POS
Antibody Screen: NEGATIVE

## 2012-09-12 LAB — CBC WITH DIFFERENTIAL/PLATELET
Eosinophils Absolute: 0.2 10*3/uL (ref 0.0–0.7)
HCT: 40.8 % (ref 36.0–46.0)
Hemoglobin: 14.6 g/dL (ref 12.0–15.0)
Lymphs Abs: 1.4 10*3/uL (ref 0.7–4.0)
MCH: 30.4 pg (ref 26.0–34.0)
MCHC: 35.8 g/dL (ref 30.0–36.0)
MCV: 85 fL (ref 78.0–100.0)
Monocytes Absolute: 0.3 10*3/uL (ref 0.1–1.0)
Monocytes Relative: 5 % (ref 3–12)
Neutrophils Relative %: 70 % (ref 43–77)
RBC: 4.8 MIL/uL (ref 3.87–5.11)

## 2012-09-12 LAB — POCT I-STAT, CHEM 8
Calcium, Ion: 1.18 mmol/L (ref 1.12–1.23)
Creatinine, Ser: 1.5 mg/dL — ABNORMAL HIGH (ref 0.50–1.10)
Glucose, Bld: 96 mg/dL (ref 70–99)
Hemoglobin: 14.3 g/dL (ref 12.0–15.0)
Potassium: 4.6 mEq/L (ref 3.5–5.1)

## 2012-09-12 LAB — ABO/RH: ABO/RH(D): A POS

## 2012-09-12 LAB — PROTIME-INR: INR: 1.03 (ref 0.00–1.49)

## 2012-09-12 SURGERY — VIDEO ASSISTED THORACOSCOPY
Anesthesia: General | Site: Chest | Laterality: Right | Wound class: Clean Contaminated

## 2012-09-12 MED ORDER — ONDANSETRON HCL 4 MG/2ML IJ SOLN
4.0000 mg | Freq: Four times a day (QID) | INTRAMUSCULAR | Status: DC | PRN
Start: 2012-09-12 — End: 2012-09-12

## 2012-09-12 MED ORDER — OXYCODONE-ACETAMINOPHEN 5-325 MG PO TABS
1.0000 | ORAL_TABLET | ORAL | Status: DC | PRN
  Administered 2012-09-13 – 2012-09-14 (×3): 1 via ORAL
  Administered 2012-09-15: 2 via ORAL
  Administered 2012-09-15 – 2012-09-16 (×2): 1 via ORAL
  Filled 2012-09-12 (×2): qty 1
  Filled 2012-09-12 (×2): qty 2
  Filled 2012-09-12 (×2): qty 1

## 2012-09-12 MED ORDER — BISACODYL 5 MG PO TBEC
10.0000 mg | DELAYED_RELEASE_TABLET | Freq: Every day | ORAL | Status: DC
  Administered 2012-09-13 – 2012-09-15 (×3): 10 mg via ORAL
  Filled 2012-09-12 (×3): qty 2

## 2012-09-12 MED ORDER — DEXTROSE-NACL 5-0.45 % IV SOLN
INTRAVENOUS | Status: DC
  Administered 2012-09-12: 75 mL/h via INTRAVENOUS
  Administered 2012-09-14: via INTRAVENOUS

## 2012-09-12 MED ORDER — FENTANYL 10 MCG/ML IV SOLN
INTRAVENOUS | Status: DC
  Administered 2012-09-12: 16:00:00 via INTRAVENOUS
  Administered 2012-09-12: 255 ug via INTRAVENOUS
  Administered 2012-09-13: 101.5 ug via INTRAVENOUS
  Administered 2012-09-13: 60 ug via INTRAVENOUS
  Administered 2012-09-13: 120 ug via INTRAVENOUS
  Administered 2012-09-13 (×2): 105 ug via INTRAVENOUS
  Administered 2012-09-13 – 2012-09-14 (×2): 90 ug via INTRAVENOUS
  Administered 2012-09-14: 135 ug via INTRAVENOUS
  Administered 2012-09-14: 15 ug via INTRAVENOUS
  Administered 2012-09-14: 1.05 ug via INTRAVENOUS
  Administered 2012-09-14: 65 ug via INTRAVENOUS
  Administered 2012-09-14: 133.2 ug via INTRAVENOUS
  Administered 2012-09-14: 22:00:00 via INTRAVENOUS
  Administered 2012-09-15: 135 ug via INTRAVENOUS
  Administered 2012-09-15: 30 ug via INTRAVENOUS
  Filled 2012-09-12 (×4): qty 50

## 2012-09-12 MED ORDER — ACETAMINOPHEN 160 MG/5ML PO SOLN
1000.0000 mg | Freq: Four times a day (QID) | ORAL | Status: AC
  Administered 2012-09-12: 1000 mg via ORAL
  Filled 2012-09-12: qty 40.6

## 2012-09-12 MED ORDER — SUCCINYLCHOLINE CHLORIDE 20 MG/ML IJ SOLN
INTRAMUSCULAR | Status: DC | PRN
  Administered 2012-09-12: 100 mg via INTRAVENOUS

## 2012-09-12 MED ORDER — TRIAMCINOLONE ACETONIDE 0.1 % EX CREA
1.0000 | TOPICAL_CREAM | Freq: Every day | CUTANEOUS | Status: DC
Start: 2012-09-13 — End: 2012-09-16
  Filled 2012-09-12: qty 15

## 2012-09-12 MED ORDER — MIDAZOLAM HCL 5 MG/5ML IJ SOLN
INTRAMUSCULAR | Status: DC | PRN
  Administered 2012-09-12: 2 mg via INTRAVENOUS

## 2012-09-12 MED ORDER — FAMOTIDINE 40 MG PO TABS
40.0000 mg | ORAL_TABLET | Freq: Every day | ORAL | Status: DC
  Administered 2012-09-12 – 2012-09-15 (×4): 40 mg via ORAL
  Filled 2012-09-12 (×6): qty 1

## 2012-09-12 MED ORDER — ROCURONIUM BROMIDE 100 MG/10ML IV SOLN
INTRAVENOUS | Status: DC | PRN
  Administered 2012-09-12: 50 mg via INTRAVENOUS

## 2012-09-12 MED ORDER — SODIUM CHLORIDE 0.9 % IJ SOLN: 9.0000 mL | INTRAMUSCULAR | Status: DC | PRN

## 2012-09-12 MED ORDER — DIPHENHYDRAMINE HCL 50 MG/ML IJ SOLN: 12.5000 mg | Freq: Four times a day (QID) | INTRAMUSCULAR | Status: DC | PRN

## 2012-09-12 MED ORDER — BUPIVACAINE HCL (PF) 0.5 % IJ SOLN
INTRAMUSCULAR | Status: AC
  Filled 2012-09-12: qty 30

## 2012-09-12 MED ORDER — ACETAMINOPHEN 160 MG/5ML PO SOLN
1000.0000 mg | Freq: Four times a day (QID) | ORAL | Status: DC
  Filled 2012-09-12 (×3): qty 40

## 2012-09-12 MED ORDER — BUPIVACAINE HCL 0.5 % IJ SOLN
INTRAMUSCULAR | Status: DC | PRN
  Administered 2012-09-12: 30 mL

## 2012-09-12 MED ORDER — LACTATED RINGERS IV SOLN
INTRAVENOUS | Status: DC | PRN
  Administered 2012-09-12: 12:00:00 via INTRAVENOUS

## 2012-09-12 MED ORDER — ACETAMINOPHEN 500 MG PO TABS
1000.0000 mg | ORAL_TABLET | Freq: Four times a day (QID) | ORAL | Status: AC
  Administered 2012-09-13 (×2): 1000 mg via ORAL
  Filled 2012-09-12 (×4): qty 2

## 2012-09-12 MED ORDER — GLYCOPYRROLATE 0.2 MG/ML IJ SOLN
INTRAMUSCULAR | Status: DC | PRN
  Administered 2012-09-12: 0.2 mg via INTRAVENOUS
  Administered 2012-09-12: 0.4 mg via INTRAVENOUS

## 2012-09-12 MED ORDER — OXYCODONE HCL 5 MG/5ML PO SOLN: 5.0000 mg | Freq: Once | ORAL | Status: DC | PRN

## 2012-09-12 MED ORDER — NEOSTIGMINE METHYLSULFATE 1 MG/ML IJ SOLN
INTRAMUSCULAR | Status: DC | PRN
  Administered 2012-09-12: 3 mg via INTRAVENOUS
  Administered 2012-09-12: 1 mg via INTRAVENOUS

## 2012-09-12 MED ORDER — LIDOCAINE HCL (CARDIAC) 20 MG/ML IV SOLN
INTRAVENOUS | Status: DC | PRN
  Administered 2012-09-12: 80 mg via INTRAVENOUS

## 2012-09-12 MED ORDER — PROMETHAZINE HCL 25 MG/ML IJ SOLN: 6.2500 mg | INTRAMUSCULAR | Status: DC | PRN

## 2012-09-12 MED ORDER — HYDROMORPHONE HCL PF 1 MG/ML IJ SOLN
INTRAMUSCULAR | Status: AC
  Administered 2012-09-12: 0.5 mg via INTRAVENOUS
  Filled 2012-09-12: qty 2

## 2012-09-12 MED ORDER — OXYCODONE HCL 5 MG PO TABS: 5.0000 mg | ORAL_TABLET | Freq: Once | ORAL | Status: DC | PRN

## 2012-09-12 MED ORDER — NALOXONE HCL 0.4 MG/ML IJ SOLN: 0.4000 mg | INTRAMUSCULAR | Status: DC | PRN

## 2012-09-12 MED ORDER — FENTANYL CITRATE 0.05 MG/ML IJ SOLN
INTRAMUSCULAR | Status: DC | PRN
  Administered 2012-09-12: 50 ug via INTRAVENOUS
  Administered 2012-09-12 (×3): 100 ug via INTRAVENOUS

## 2012-09-12 MED ORDER — TRETINOIN 0.05 % EX CREA: TOPICAL_CREAM | Freq: Every day | CUTANEOUS | Status: DC

## 2012-09-12 MED ORDER — ENALAPRIL MALEATE 2.5 MG PO TABS
2.5000 mg | ORAL_TABLET | Freq: Every day | ORAL | Status: DC
  Administered 2012-09-12 – 2012-09-15 (×4): 2.5 mg via ORAL
  Filled 2012-09-12 (×5): qty 1

## 2012-09-12 MED ORDER — 0.9 % SODIUM CHLORIDE (POUR BTL) OPTIME
TOPICAL | Status: DC | PRN
  Administered 2012-09-12: 1000 mL

## 2012-09-12 MED ORDER — ONDANSETRON HCL 4 MG/2ML IJ SOLN
4.0000 mg | Freq: Four times a day (QID) | INTRAMUSCULAR | Status: DC | PRN
  Administered 2012-09-13: 4 mg via INTRAVENOUS
  Filled 2012-09-12: qty 2

## 2012-09-12 MED ORDER — ONDANSETRON HCL 4 MG/2ML IJ SOLN
INTRAMUSCULAR | Status: DC | PRN
  Administered 2012-09-12: 4 mg via INTRAVENOUS

## 2012-09-12 MED ORDER — PROPOFOL 10 MG/ML IV BOLUS
INTRAVENOUS | Status: DC | PRN
  Administered 2012-09-12: 40 mg via INTRAVENOUS
  Administered 2012-09-12: 160 mg via INTRAVENOUS

## 2012-09-12 MED ORDER — OXYCODONE HCL 5 MG PO TABS: 5.0000 mg | ORAL_TABLET | ORAL | Status: AC | PRN

## 2012-09-12 MED ORDER — DIPHENHYDRAMINE HCL 12.5 MG/5ML PO ELIX
12.5000 mg | ORAL_SOLUTION | Freq: Four times a day (QID) | ORAL | Status: DC | PRN
  Filled 2012-09-12: qty 5

## 2012-09-12 MED ORDER — HYDROMORPHONE HCL PF 1 MG/ML IJ SOLN
0.2500 mg | INTRAMUSCULAR | Status: DC | PRN
  Administered 2012-09-12 (×3): 0.5 mg via INTRAVENOUS

## 2012-09-12 MED ORDER — DEXTROSE 5 % IV SOLN
1.5000 g | INTRAVENOUS | Status: AC
  Administered 2012-09-12: 1.5 g via INTRAVENOUS
  Filled 2012-09-12: qty 1.5

## 2012-09-12 MED ORDER — DEXTROSE 5 % IV SOLN
1.5000 g | Freq: Two times a day (BID) | INTRAVENOUS | Status: AC
  Administered 2012-09-12 – 2012-09-13 (×2): 1.5 g via INTRAVENOUS
  Filled 2012-09-12 (×3): qty 1.5

## 2012-09-12 MED ORDER — POTASSIUM CHLORIDE 10 MEQ/50ML IV SOLN
10.0000 meq | Freq: Every day | INTRAVENOUS | Status: DC | PRN
  Filled 2012-09-12: qty 50

## 2012-09-12 MED ORDER — POLYETHYLENE GLYCOL 3350 17 G PO PACK
17.0000 g | PACK | Freq: Every day | ORAL | Status: DC
  Administered 2012-09-12 – 2012-09-15 (×4): 17 g via ORAL
  Filled 2012-09-12 (×5): qty 1

## 2012-09-12 MED ORDER — ACETAMINOPHEN 500 MG PO TABS
1000.0000 mg | ORAL_TABLET | Freq: Four times a day (QID) | ORAL | Status: DC
  Filled 2012-09-12 (×3): qty 2

## 2012-09-12 SURGICAL SUPPLY — 64 items
APPLICATOR TIP EXT COSEAL (VASCULAR PRODUCTS) IMPLANT
APPLIER CLIP ROT 10 11.4 M/L (STAPLE)
CANISTER SUCTION 2500CC (MISCELLANEOUS) ×3 IMPLANT
CATH KIT ON Q 5IN SLV (PAIN MANAGEMENT) IMPLANT
CATH THORACIC 28FR (CATHETERS) IMPLANT
CATH THORACIC 36FR (CATHETERS) IMPLANT
CATH THORACIC 36FR RT ANG (CATHETERS) IMPLANT
CLIP APPLIE ROT 10 11.4 M/L (STAPLE) IMPLANT
CLIP TI MEDIUM 6 (CLIP) ×3 IMPLANT
CLOTH BEACON ORANGE TIMEOUT ST (SAFETY) ×3 IMPLANT
CONN ST 1/4X3/8  BEN (MISCELLANEOUS) ×1
CONN ST 1/4X3/8 BEN (MISCELLANEOUS) ×2 IMPLANT
CONT SPEC 4OZ CLIKSEAL STRL BL (MISCELLANEOUS) ×6 IMPLANT
COVER SURGICAL LIGHT HANDLE (MISCELLANEOUS) ×3 IMPLANT
DERMABOND ADVANCED (GAUZE/BANDAGES/DRESSINGS) ×1
DERMABOND ADVANCED .7 DNX12 (GAUZE/BANDAGES/DRESSINGS) ×2 IMPLANT
DRAPE LAPAROSCOPIC ABDOMINAL (DRAPES) ×3 IMPLANT
DRAPE WARM FLUID 44X44 (DRAPE) ×3 IMPLANT
ELECT REM PT RETURN 9FT ADLT (ELECTROSURGICAL) ×3
ELECTRODE REM PT RTRN 9FT ADLT (ELECTROSURGICAL) ×2 IMPLANT
FLUID NSS /IRRIG 3000 ML XXX (IV SOLUTION) IMPLANT
GLOVE BIO SURGEON STRL SZ 6 (GLOVE) ×9 IMPLANT
GLOVE BIO SURGEON STRL SZ 6.5 (GLOVE) ×3 IMPLANT
GLOVE EUDERMIC 7 POWDERFREE (GLOVE) IMPLANT
GLOVE ORTHO TXT STRL SZ7.5 (GLOVE) ×6 IMPLANT
GOWN STRL NON-REIN LRG LVL3 (GOWN DISPOSABLE) ×12 IMPLANT
HANDLE STAPLE ENDO GIA SHORT (STAPLE) ×1
KIT BASIN OR (CUSTOM PROCEDURE TRAY) ×3 IMPLANT
KIT ROOM TURNOVER OR (KITS) ×3 IMPLANT
NEEDLE INTRO FOURFACET TIP 11G (NEEDLE) ×3 IMPLANT
NS IRRIG 1000ML POUR BTL (IV SOLUTION) ×6 IMPLANT
PACK CHEST (CUSTOM PROCEDURE TRAY) ×3 IMPLANT
PAD ARMBOARD 7.5X6 YLW CONV (MISCELLANEOUS) ×6 IMPLANT
RELOAD EGIA 45 MED/THCK PURPLE (STAPLE) ×6 IMPLANT
SEALANT SURG COSEAL 4ML (VASCULAR PRODUCTS) IMPLANT
SEALANT SURG COSEAL 8ML (VASCULAR PRODUCTS) IMPLANT
SET IRRIG TUBING LAPAROSCOPIC (IRRIGATION / IRRIGATOR) IMPLANT
SOLUTION ANTI FOG 6CC (MISCELLANEOUS) ×3 IMPLANT
SPONGE GAUZE 4X4 12PLY (GAUZE/BANDAGES/DRESSINGS) ×3 IMPLANT
STAPLER ENDO GIA 12MM SHORT (STAPLE) ×2 IMPLANT
SUT MNCRL AB 3-0 PS2 18 (SUTURE) ×3 IMPLANT
SUT PROLENE 3 0 SH DA (SUTURE) IMPLANT
SUT PROLENE 4 0 RB 1 (SUTURE)
SUT PROLENE 4-0 RB1 .5 CRCL 36 (SUTURE) IMPLANT
SUT SILK  1 MH (SUTURE) ×2
SUT SILK 1 MH (SUTURE) ×4 IMPLANT
SUT SILK 2 0SH CR/8 30 (SUTURE) IMPLANT
SUT VIC AB 1 CTX 18 (SUTURE) IMPLANT
SUT VIC AB 1 CTX 36 (SUTURE)
SUT VIC AB 1 CTX36XBRD ANBCTR (SUTURE) IMPLANT
SUT VIC AB 2-0 CTX 36 (SUTURE) IMPLANT
SUT VIC AB 2-0 UR6 27 (SUTURE) ×6 IMPLANT
SUT VIC AB 3-0 SH 18 (SUTURE) ×3 IMPLANT
SUT VIC AB 3-0 X1 27 (SUTURE) ×3 IMPLANT
SUT VICRYL 2 TP 1 (SUTURE) IMPLANT
SYSTEM SAHARA CHEST DRAIN RE-I (WOUND CARE) ×3 IMPLANT
TAPE CLOTH 4X10 WHT NS (GAUZE/BANDAGES/DRESSINGS) ×3 IMPLANT
TAPE CLOTH SURG 4X10 WHT LF (GAUZE/BANDAGES/DRESSINGS) ×3 IMPLANT
TIP APPLICATOR SPRAY EXTEND 16 (VASCULAR PRODUCTS) IMPLANT
TOWEL OR 17X24 6PK STRL BLUE (TOWEL DISPOSABLE) ×6 IMPLANT
TOWEL OR 17X26 10 PK STRL BLUE (TOWEL DISPOSABLE) ×9 IMPLANT
TRAP SPECIMEN MUCOUS 40CC (MISCELLANEOUS) ×3 IMPLANT
TRAY FOLEY CATH 14FRSI W/METER (CATHETERS) ×3 IMPLANT
WATER STERILE IRR 1000ML POUR (IV SOLUTION) ×6 IMPLANT

## 2012-09-12 NOTE — ED Provider Notes (Signed)
CSN: [id number]     Arrival date & time 09/12/12  0910 History     First MD Initiated Contact with Patient 09/12/12 [phone]     Chief Complaint  Patient presents with  . Shortness of Breath  . Cough   (Consider location/radiation/quality/duration/timing/severity/associated sxs/prior Treatment) HPI Patient is a 22 year old female who came in to emergency department today complaining of pain in the right side of her chest and shortness of breath. Patient states her pain began just 2 hours prior to arrival to emergency department while brushing her teeth. Patient denies any injury, coughing, sneezing. Patient states she was just in the hospital with spontaneous right pneumothorax. States was discharged 4 days ago and was doing well until today. Patient states symptoms are similar to her initial presentation several days ago however today she states pain is not as severe. Patient denies taking any medications prior to arrival. Patient has no other complaints.  Past Medical History  Diagnosis Date  . Pneumothorax    History reviewed. No pertinent past surgical history. History reviewed. No pertinent family history. History  Substance Use Topics  . Smoking status: Never Smoker   . Smokeless tobacco: Not on file  . Alcohol Use: No   OB History   Grav Para Term Preterm Abortions TAB SAB Ect Mult Living                 Review of Systems  Respiratory: Positive for cough and shortness of breath.   Cardiovascular: Positive for chest pain.  Gastrointestinal: Negative.   Genitourinary: Negative.   All other systems reviewed and are negative.    Allergies  Review of patient's allergies indicates no known allergies.  Home Medications   Current Outpatient Rx  Name  Route  Sig  Dispense  Refill  . Acetaminophen (TYLENOL PO)   Oral   Take 2 capsules by mouth every 6 (six) hours as needed (headaches).         . enalapril (VASOTEC) 2.5 MG tablet   Oral   Take 2.5 mg by mouth at  bedtime.         Marland Kitchen loratadine (CLARITIN) 10 MG tablet   Oral   Take 10 mg by mouth daily as needed for allergies.         . polyethylene glycol (MIRALAX / GLYCOLAX) packet   Oral   Take 17 g by mouth daily.         . ranitidine (ZANTAC) 300 MG tablet   Oral   Take 300 mg by mouth at bedtime.         . traMADol (ULTRAM) 50 MG tablet   Oral   Take 1 tablet (50 mg total) by mouth every 4 (four) hours as needed for pain.   30 tablet   0   . tretinoin (RETIN-A) 0.05 % cream   Topical   Apply topically at bedtime.         . triamcinolone cream (KENALOG) 0.1 %   Topical   Apply 1 application topically daily.          BP 130/95  Pulse 73  Temp(Src) 98.1 F (36.7 C) (Oral)  Resp 17  SpO2 100%  LMP 09/05/2012 Physical Exam  Nursing note and vitals reviewed. Constitutional: She appears well-developed and well-nourished. No distress.  HENT:  Head: Normocephalic.  Eyes: Conjunctivae are normal.  Neck: Neck supple. No JVD present. No tracheal deviation present.  Cardiovascular: Normal rate, regular rhythm and normal heart sounds.   Pulmonary/Chest:  Effort normal. No respiratory distress. She has no wheezes. She has no rales.  Decreased sounds on the right side  Abdominal: Soft. Bowel sounds are normal. She exhibits no distension. There is no tenderness. There is no rebound.  Musculoskeletal: She exhibits no edema.  Neurological: She is alert.  Skin: Skin is warm and dry.  Psychiatric: She has a normal mood and affect. Her behavior is normal.    ED Course   Procedures (including critical care time)  Labs Reviewed  POCT I-STAT, CHEM 8 - Abnormal; Notable for the following:    BUN 24 (*)    Creatinine, Ser 1.50 (*)    All other components within normal limits  CBC WITH DIFFERENTIAL  PROTIME-INR  APTT  TYPE AND SCREEN   Dg Chest 2 View  09/12/2012   *RADIOLOGY REPORT*  Clinical Data: Shortness of breath, history of pneumothorax recently treated with chest  tube, chest tube removed 09/08/2012  CHEST - 2 VIEW  Comparison:  Multiple recent prior studies  Findings: Large right pneumothorax has recurred measuring up to 7 cm.  Right lung is collapsed and there is mild shift of the heart and mediastinal contents toward the left.  Left lung is clear.  IMPRESSION: Recurrent large right pneumothorax. Critical Value/emergent results were called by telephone at the time of interpretation on 09/13/1938 at pain 10:12 a.m. to the referring physician, who verbally acknowledged these results.   Original Report Authenticated By: Esperanza Heir, M.D.    Date: 09/12/2012  Rate: 71  Rhythm: normal sinus rhythm  QRS Axis: normal  Intervals: normal  ST/T Wave abnormalities: normal and nonspecific T wave changes  Conduction Disutrbances:none  Narrative Interpretation:   Old EKG Reviewed: none available   1. Pneumothorax on right     MDM  Patient with recurrent right spontaneous pneumothorax. Patient is hemodynamically stable with normal vital signs.  Spoke with Dr. Cornelius Moras as soon as CXR was obtained. Dr. Cornelius Moras to see pt in ED. Pt was started on oxygen. She continuous to be in no distress.   Filed Vitals:   09/12/12 0928 09/12/12 1015 09/12/12 1057  BP: 130/96 130/95 130/95  Pulse: 97 73   Temp: 98.1 F (36.7 C)    TempSrc: Oral    Resp: 18 17 14   SpO2: 100% 100% 100%     Abdiel Blackerby A Calissa Swenor, PA-C 09/12/12 1140

## 2012-09-12 NOTE — Progress Notes (Signed)
Patient received to room 3s16 from PACU. Parents at bedside, instructed all in safety and to call for assistance. Chest tubed dressing clean dry and intact. Chest tube placed to 20cm wall suction. Vitals obtained and patient resting at present.

## 2012-09-12 NOTE — Progress Notes (Signed)
T[facility] BRIEF PROGRESS NOTE  Day of Surgery  S/P Procedure(s) (LRB): VIDEO ASSISTED THORACOSCOPY/STAPLING OF BLEBS (Right)   Doing well post op Minimal pain Breathing comfortably CXR looks good  Plan: Continue routine early postop  Christian Borgerding H 09/12/2012 3:43 PM

## 2012-09-12 NOTE — Op Note (Addendum)
CARDIOTHORACIC SURGERY OPERATIVE NOTE  Date of Procedure:   09/12/2012  Preoperative Diagnosis:  Recurrent Right Spontaneous Pneumothorax  Postoperative Diagnosis:  same  Procedure:    Right Video-assisted Thoracoscopy for Bleb Resection, Apical Pleurectomy and Mechanical Pleurodesis   Surgeon:    Salvatore Decent. Cornelius Moras, MD  Assistant:    Lowella Dandy, PA-C  Anesthesia:    Remonia Richter, MD      BRIEF CLINICAL NOTE AND INDICATIONS FOR SURGERY  Patient is a 22 year old female with Ehlers-Danlos syndrome and chronic kidney disease who was hospitalized one week ago with right spontaneous pneumothorax. She underwent chest tube placement by Dr. Donata Clay. The pneumothorax resolved and the chest tube was removed uneventfully 2 days later. The patient states that this morning she developed sudden onset pleuritic right-sided chest pain and cough. She returned to the emergency department where chest x-ray reveals recurrent right pneumothorax. She reports only mild shortness of breath. She otherwise feels well. She has no history of recent trauma nor respiratory infection.  The patient has been seen in consultation and counseled at length regarding the indications, risks and potential benefits of surgery.  All questions have been answered, and the patient provides full informed consent for the operation as described.       DETAILS OF THE OPERATIVE PROCEDURE  The patient is brought to the operating room on the above mentioned date and placed in the supine position on the operating table. Central venous line and radial arterial line are placed by the anesthesia team. Intravenous antibiotics are administered and pneumatic sequential compression boots are placed on both lower extremities. General endotracheal anesthesia is induced uneventfully.  The patient was intubated using a dual lumen endotracheal tube. The patient is turned to the left lateral decubitus position.  An axillary roll is placed and  the patient's right chest was prepared and draped in a sterile manner.  A small incision is made overlying the seventh intercostal space and the anterior axillary line. The incision is completed into the pleural space with electrocautery.  A 10 mm port is placed through the incision and a thoracoscopic camera passed through the port.  The right chest is explored visually.  The right lung is completely collapsed.  There are no adhesions.  There are no obvious abnormalities on the surface of the lung.  Two additional incisions are made including one anteriorly overlying the 4th intercostal space and one posteriorly over the 6th intercostal space.  Instruments are passed through these incisions to maneuver the lung.  The right lung apex is mobilized.  There are no large blebs but suggestion of small blebs at the apex.  The remainder of the lung is carefully examined and no other abnormalities noted.  The right lung apex is removed using an endoGI stapler.    The parietal pleura is removed from the entire apical, anterior, posterior and lateral chest wall.  The surface of the right diaphragm is abraded with a cautery scratch pad.  Hemostasis is ascertained.  The pleural space is drained using a single 32 French Bard chest tube exited through the original port incision. A total of 20 mL of 0.5% bupivacaine is administered as an intercostal nerve block.  The remaining incisions are closed in multiple layers in routine fashion. The chest tube was fixed to close suction drainage device.  The patient tolerated the procedure well, was extubated in the operative room, and transported to the postanesthesia care in stable condition. All sponge instrument and needle counts are verified  correct. There were no intraoperative complications. Estimated blood loss was trivial.      Salvatore Decent. Cornelius Moras MD 09/12/2012 1:46 PM

## 2012-09-12 NOTE — Anesthesia Postprocedure Evaluation (Signed)
Anesthesia Post Note  Patient: Denise Compton  Procedure(s) Performed: Procedure(s) (LRB): VIDEO ASSISTED THORACOSCOPY/STAPLING OF BLEBS (Right)  Anesthesia type: general  Patient location: PACU  Post pain: Pain level controlled  Post assessment: Patient's Cardiovascular Status Stable  Last Vitals:  Filed Vitals:   09/12/12 1551  BP: 150/95  Pulse: 60  Temp:   Resp: 11    Post vital signs: Reviewed and stable  Level of consciousness: sedated  Complications: No apparent anesthesia complications

## 2012-09-12 NOTE — H&P (Signed)
[address]       [name] [address]             [phone]          CARDIOTHORACIC SURGERY HISTORY AND PHYSICAL EXAM  PCP is Default, Provider, MD  Chief Complaint:  Right sided chest pain and cough  HPI:  Patient is a 22 year old female with Ehlers-Danlos syndrome and chronic kidney disease who was hospitalized one week ago with right spontaneous pneumothorax. She underwent chest tube placement by Dr. Donata Clay. The pneumothorax resolved and the chest tube was removed uneventfully 2 days later. The patient states that this morning she developed sudden onset pleuritic right-sided chest pain and cough. She returned to the emergency department where chest x-ray reveals recurrent right pneumothorax. She reports only mild shortness of breath. She otherwise feels well. She has no history of recent trauma nor respiratory infection.  Past Medical History  Diagnosis Date  . Pneumothorax 09/06/2012    Right Side  . 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 Ehlers-Danlos syndrome 09/12/2012  . Gastroesophageal reflux 09/12/2012    Past Surgical History  Procedure Laterality Date  . Chest tube insertion Right 09/06/2012  . Renal biopsy, percutaneous      History reviewed. No pertinent family history.  Social History History  Substance Use Topics  . Smoking status: Never Smoker   . Smokeless tobacco: Not on file  . Alcohol Use: No    Prior to Admission medications   Medication Sig Start Date End Date Taking? Authorizing Provider  Acetaminophen (TYLENOL PO) Take 2 capsules by mouth every 6 (six) hours as needed (headaches).   Yes Historical Provider, MD  enalapril (VASOTEC) 2.5 MG tablet Take 2.5 mg by mouth at bedtime.   Yes Historical Provider, MD  loratadine (CLARITIN) 10 MG tablet Take 10 mg by mouth daily as needed for allergies.   Yes Historical Provider,  MD  polyethylene glycol (MIRALAX / GLYCOLAX) packet Take 17 g by mouth daily.   Yes Historical Provider, MD  ranitidine (ZANTAC) 300 MG tablet Take 300 mg by mouth at bedtime.   Yes Historical Provider, MD  traMADol (ULTRAM) 50 MG tablet Take 1 tablet (50 mg total) by mouth every 4 (four) hours as needed for pain. 09/08/12  Yes Donielle Margaretann Loveless, PA-C  tretinoin (RETIN-A) 0.05 % cream Apply topically at bedtime.   Yes Historical Provider, MD  triamcinolone cream (KENALOG) 0.1 % Apply 1 application topically daily.   Yes Historical Provider, MD    No Known Allergies  Review of Systems:  General:  normal appetite, normal energy   Respiratory:  + cough, no wheezing, no hemoptysis, + pain on right side with inspiration or cough, mild shortness of breath   Cardiac:  No exertional chest pain or tightness, no exertional SOB, no resting SOB, no PND, no orthopnea, no LE edema, no palpitations, no syncope  GI:   no difficulty swallowing, no hematochezia, no hematemesis, no melena, no constipation, no diarrhea   GU:   no dysuria, no urgency, no frequency   Musculoskeletal: no arthritis, no arthralgia   Vascular:  no pain suggestive of claudication   Neuro:   no symptoms suggestive of TIA's, no seizures, no headaches, no peripheral neuropathy   Endocrine:  Negative   HEENT:  no loose teeth or painful teeth,  no recent vision changes  Psych:   no anxiety, no depression    Physical Exam:   BP 130/95  Pulse 73  Temp(Src) 98.1 F (36.7 C) (Oral)  Resp 14  SpO2 100%  LMP 09/05/2012  General:  Thin, well-appearing, breathing comfortably  HEENT:  Unremarkable   Neck:   no JVD, no bruits, no adenopathy   Chest:   Markedly diminished breath sounds on right, clear on left, no wheezes, no rhonchi   CV:   RRR, no murmur   Abdomen:  soft, non-tender, no masses   Extremities:  warm, well-perfused, pulses palpable  Rectal/GU  Deferred  Neuro:   Grossly non-focal and symmetrical  throughout  Skin:   Clean and dry, no rashes, no breakdown  Diagnostic Tests:  *RADIOLOGY REPORT*  Clinical Data: Shortness of breath, history of pneumothorax  recently treated with chest tube, chest tube removed 09/08/2012  CHEST - 2 VIEW  Comparison: Multiple recent prior studies  Findings: Large right pneumothorax has recurred measuring up to 7  cm. Right lung is collapsed and there is mild shift of the heart  and mediastinal contents toward the left. Left lung is clear.  IMPRESSION:  Recurrent large right pneumothorax. Critical Value/emergent results  were called by telephone at the time of interpretation on  09/13/1938 at pain 10:12 a.m. to the referring physician, who  verbally acknowledged these results.  Original Report Authenticated By: Esperanza Heir, M.D.    Impression:  Large recurrent right spontaneous pneumothorax   Plan:  I've discussed options at length with the patient here in the emergency department and over the telephone with her mother. We discussed proceeding directly to the operating room for video-assisted thoracoscopy with stapling of blebs and mechanical pleurocentesis. We also discussed the alternative of simply replacing another chest tube. We plan to proceed directly to the operating room for more definitive management. The patient and her mother both understand and accept all potential associated risks including risk of death, pulmonary embolus, aspiration, pneumonia, bleeding requiring blood transfusion, arrhythmia, prolonged airleak requiring chest tube drainage or reoperation, prolonged chest wall pain, or late recurrence of spontaneous pneumothorax. All their questions been addressed.    Salvatore Decent. Cornelius Moras, MD

## 2012-09-12 NOTE — ED Provider Notes (Signed)
Medical screening examination/treatment/procedure(s) were conducted as a shared visit with non-physician practitioner(s) and myself.  I personally evaluated the patient during the encounter and agree with physical exam and plan of care.  Patient is a 22 y.o. F with history of Ehlers Danlos syndrome with frequent spontaneous pneumothoraces who presents emergency department with right-sided pain and cough. Patient with diminished right-sided breath sounds. No respiratory distress. Oxygen saturation 100% on room air. X-ray confirms a large right-sided pneumothorax. No signs or symptoms of tension pneumothorax. Patient is hemodynamically stable. Cardiothoracic surgery has been contacted.  Layla Maw Ward, DO 09/12/12 1715

## 2012-09-12 NOTE — ED Notes (Signed)
Pt was here on right side spont pneumothorax on Monday, had chest tube dc on wed. Report symptoms returned this am, having right side pain and cough. spo2 100% at triage, right side lung sounds diminished, no resp distress noted at this time.

## 2012-09-12 NOTE — Brief Op Note (Addendum)
09/12/2012  1:45 PM  PATIENT:  Denise Compton  22 y.o. female  PRE-OPERATIVE DIAGNOSIS:  recurrent rigth spontaneous pneumothorax  POST-OPERATIVE DIAGNOSIS:  recurrent rigth spontaneous pneumothorax  PROCEDURE:  Procedure(s):  VIDEO ASSISTED THORACOSCOPY (RIGHT) -STAPLING OF BLEBS  -PARIETAL PLEURECTOMY -MECHANICAL PLEURODESIS  SURGEON:  Surgeon(s) and Role:    * Purcell Nails, MD - Primary  PHYSICIAN ASSISTANT: Erin Barrett PA-C  ANESTHESIA:   general  EBL:  Total I/O In: 900 [I.V.:900] Out: 150 [Urine:150]  BLOOD ADMINISTERED:none  DRAINS: Chest tube right chest   LOCAL MEDICATIONS USED:  NONE  SPECIMEN:  Source of Specimen:  Apical Bleb, Right Upper Lobe  DISPOSITION OF SPECIMEN:  PATHOLOGY  COUNTS:  YES  TOURNIQUET:  * No tourniquets in log *  DICTATION: .Dragon Dictation  PLAN OF CARE: Admit to inpatient   PATIENT DISPOSITION:  PACU - hemodynamically stable.   Delay start of Pharmacological VTE agent (>24hrs) due to surgical blood loss or risk of bleeding: yes  Bonnee Zertuche H 09/12/2012 1:55 PM

## 2012-09-12 NOTE — Transfer of Care (Signed)
Immediate Anesthesia Transfer of Care Note  Patient: Denise Compton  Procedure(s) Performed: Procedure(s): VIDEO ASSISTED THORACOSCOPY/STAPLING OF BLEBS (Right)  Patient Location: PACU  Anesthesia Type:General  Level of Consciousness: awake, alert , oriented and patient cooperative  Airway & Oxygen Therapy: Patient Spontanous Breathing and Patient connected to nasal cannula oxygen  Post-op Assessment: Report given to PACU RN, Post -op Vital signs reviewed and stable and Patient moving all extremities  Post vital signs: Reviewed and stable  Complications: No apparent anesthesia complications

## 2012-09-12 NOTE — Anesthesia Procedure Notes (Addendum)
Procedure Name: Intubation Date/Time: 09/12/2012 12:44 PM Performed by: Ferol Luz L Pre-anesthesia Checklist: Patient identified, Emergency Drugs available, Suction available, Patient being monitored and Timeout performed Patient Re-evaluated:Patient Re-evaluated prior to inductionOxygen Delivery Method: Circle system utilized Preoxygenation: Pre-oxygenation with 100% oxygen Intubation Type: IV induction, Rapid sequence and Cricoid Pressure applied Ventilation: Mask ventilation without difficulty Laryngoscope Size: Mac and 3 Grade View: Grade I Endobronchial tube: Left and Double lumen EBT and 37 Fr Number of attempts: 1 Airway Equipment and Method: Stylet and Fiberoptic brochoscope Placement Confirmation: ETT inserted through vocal cords under direct vision,  positive ETCO2 and breath sounds checked- equal and bilateral Tube secured with: Tape Dental Injury: Teeth and Oropharynx as per pre-operative assessment  Comments: Easy atraumatic intubation position confirmed via fiberoptic scope     The patient was identified and consent obtained.  TO was performed, and full barrier precautions were used.  The skin was anesthetized with lidocaine.  Once the vein was located with the 22 ga., the wire was inserted into the vein. The insertion site was dilated and the CVL was carefully inserted and sutured in place. The patient tolerated the procedure well.  CXR was ordered for PACU.Ultrasound guidance: relevant anatomy identified, needle position confirmed, vessel patent, wire seen inside the vessel.  Images printed for the medical record.  Start: 1214 End: 1224   J. Claybon Jabs, MD

## 2012-09-12 NOTE — Anesthesia Preprocedure Evaluation (Addendum)
Anesthesia Evaluation  Patient identified by MRN, date of birth, ID band Patient awake    Reviewed: Allergy & Precautions, H&P , NPO status , Patient's Chart, lab work & pertinent test results  History of Anesthesia Complications Negative for: history of anesthetic complications  Airway Mallampati: II TM Distance: >3 FB Neck ROM: Full    Dental  (+) Teeth Intact and Dental Advisory Given   Pulmonary   Absent breath sounds on the right side. Left side is clear.  + decreased breath sounds      Cardiovascular negative cardio ROS      Neuro/Psych negative neurological ROS  negative psych ROS   GI/Hepatic Neg liver ROS, GERD-  Medicated,  Endo/Other  negative endocrine ROS  Renal/GU Renal InsufficiencyRenal disease     Musculoskeletal   Abdominal   Peds  Hematology negative hematology ROS (+)   Anesthesia Other Findings   Reproductive/Obstetrics                          Anesthesia Physical Anesthesia Plan  ASA: III  Anesthesia Plan: General   Post-op Pain Management:    Induction: Intravenous  Airway Management Planned: Double Lumen EBT  Additional Equipment: Arterial line  Intra-op Plan:   Post-operative Plan: Extubation in OR  Informed Consent:   Plan Discussed with: CRNA, Anesthesiologist and Surgeon  Anesthesia Plan Comments:         Anesthesia Quick Evaluation

## 2012-09-13 ENCOUNTER — Inpatient Hospital Stay (HOSPITAL_COMMUNITY): Payer: BC Managed Care – PPO

## 2012-09-13 ENCOUNTER — Encounter (HOSPITAL_COMMUNITY): Payer: Self-pay | Admitting: Thoracic Surgery (Cardiothoracic Vascular Surgery)

## 2012-09-13 LAB — MRSA PCR SCREENING: MRSA by PCR: NEGATIVE

## 2012-09-13 LAB — BLOOD GAS, ARTERIAL
Drawn by: 36277
O2 Content: 2 L/min
Patient temperature: 98.6
pH, Arterial: 7.377 (ref 7.350–7.450)

## 2012-09-13 LAB — CBC
Platelets: 242 10*3/uL (ref 150–400)
RBC: 4 MIL/uL (ref 3.87–5.11)
RDW: 12.2 % (ref 11.5–15.5)
WBC: 9.3 10*3/uL (ref 4.0–10.5)

## 2012-09-13 LAB — BASIC METABOLIC PANEL
CO2: 30 mEq/L (ref 19–32)
Calcium: 8.8 mg/dL (ref 8.4–10.5)
Creatinine, Ser: 1.31 mg/dL — ABNORMAL HIGH (ref 0.50–1.10)
GFR calc non Af Amer: 57 mL/min — ABNORMAL LOW (ref >90)

## 2012-09-13 MED ORDER — PROMETHAZINE HCL 25 MG/ML IJ SOLN
12.5000 mg | Freq: Four times a day (QID) | INTRAMUSCULAR | Status: DC | PRN
  Administered 2012-09-13 (×2): 12.5 mg via INTRAVENOUS
  Filled 2012-09-13 (×2): qty 1

## 2012-09-13 NOTE — Progress Notes (Signed)
      [address]       [name] [address]             [phone]      CARDIOTHORACIC SURGERY PROGRESS NOTE  1 Day Post-Op  S/P Procedure(s) (LRB): VIDEO ASSISTED THORACOSCOPY/STAPLING OF BLEBS (Right)  Subjective: Had a pretty good night but increased right chest and shoulder pain with movement this morning.  PCA relieves the pain.  Breathing comfortably.  Mild nausea.  Objective: Vital signs in last 24 hours: Temp:  [97.4 F (36.3 C)-98.9 F (37.2 C)] 98.5 F (36.9 C) (08/18 0747) Pulse Rate:  [58-97] 70 (08/18 0336) Cardiac Rhythm:  [-] Normal sinus rhythm (08/18 0336) Resp:  [8-21] 13 (08/18 0336) BP: (124-157)/(83-101) 124/90 mmHg (08/18 0336) SpO2:  [100 %] 100 % (08/18 0336) Arterial Line BP: (153-193)/(77-94) 171/90 mmHg (08/17 2300) Weight:  [56.2 kg (123 lb 14.4 oz)] 56.2 kg (123 lb 14.4 oz) (08/17 1655)  Physical Exam:  Rhythm:   sinus  Breath sounds: Slightly diminished on right but o/w clear  Heart sounds:  RRR  Incisions:  Dressings dry  Abdomen:  Soft, non-distended, non-tender  Extremities:  Warm, well-perfused  Chest tube(s):  Low output, no air leak   Intake/Output from previous day: 08/17 0701 - 08/18 0700 In: 1800 [I.V.:1800] Out: 1025 [Urine:1025] Intake/Output this shift: Total I/O In: -  Out: 400 [Urine:400]  Lab Results:  Recent Labs  09/12/12 1020 09/12/12 1033 09/13/12 0600  WBC 6.7  --  9.3  HGB 14.6 14.3 12.2  HCT 40.8 42.0 34.4*  PLT 277  --  242   BMET:  Recent Labs  09/12/12 1033 09/13/12 0600  NA 140 135  K 4.6 4.3  CL 104 99  CO2  --  30  GLUCOSE 96 107*  BUN 24* 14  CREATININE 1.50* 1.31*  CALCIUM  --  8.8    CBG (last 3)  No results found for this basename: GLUCAP,  in the last 72 hours  CXR:  *RADIOLOGY REPORT*  Clinical Data: Post VATS and stapling of apical bleb  PORTABLE CHEST - 1 VIEW  Comparison: Portable exam 0617 hours compared to 09/12/2012  Findings:  Right jugular central  venous catheter tip projects over SVC.  Right thoracostomy tube present with tip at apex.  Normal heart size, mediastinal contours, and pulmonary vascularity.  Staple line at right apex post resection.  Hyperinflated lungs with right basilar atelectasis.  No definite pneumothorax identified.  Bones unremarkable.  IMPRESSION:  Emphysematous changes with persistent right basilar atelectasis.  Right thoracostomy tube without definite pneumothorax.  Original Report Authenticated By: Ulyses Southward, M.D.   Assessment/Plan: S/P Procedure(s) (LRB): VIDEO ASSISTED THORACOSCOPY/STAPLING OF BLEBS (Right)  Overall doing well POD1 Mobilize D/C Aline D/C foley Keep tube to suction today, water seal tomorrow if no PTX or air leak  OWEN,CLARENCE H 09/13/2012 8:10 AM  -

## 2012-09-14 ENCOUNTER — Inpatient Hospital Stay (HOSPITAL_COMMUNITY): Payer: BC Managed Care – PPO

## 2012-09-14 LAB — COMPREHENSIVE METABOLIC PANEL
Alkaline Phosphatase: 56 U/L (ref 39–117)
BUN: 10 mg/dL (ref 6–23)
Chloride: 102 mEq/L (ref 96–112)
Creatinine, Ser: 1.25 mg/dL — ABNORMAL HIGH (ref 0.50–1.10)
GFR calc Af Amer: 70 mL/min — ABNORMAL LOW (ref >90)
Glucose, Bld: 95 mg/dL (ref 70–99)
Potassium: 4 mEq/L (ref 3.5–5.1)
Total Bilirubin: 0.2 mg/dL — ABNORMAL LOW (ref 0.3–1.2)

## 2012-09-14 LAB — CBC
HCT: 36.8 % (ref 36.0–46.0)
Hemoglobin: 12.5 g/dL (ref 12.0–15.0)
MCHC: 34 g/dL (ref 30.0–36.0)
MCV: 87.8 fL (ref 78.0–100.0)

## 2012-09-14 MED ORDER — BOOST / RESOURCE BREEZE PO LIQD
1.0000 | ORAL | Status: DC
  Administered 2012-09-14: 1 via ORAL

## 2012-09-14 NOTE — Discharge Summary (Signed)
Physician Discharge Summary       [address] [place]       [name] [address]             [phone]    Patient ID: Denise Compton MRN: [id number] DOB/AGE: 08-20-1990 22 y.o.  Admit date: 09/12/2012 Discharge date: 09/15/2012  Admission Diagnoses: 1.Recurrent right spontaneous pneumothorax 2.History of Ehlers-Danlos syndrome 3.History of CKD (focal sclerosing glomerulonephritis) 4.History of GERD  Discharge Diagnoses:  1.Recurrent right spontaneous pneumothorax 2.History of Ehlers-Danlos syndrome 3.History of CKD (focal sclerosing glomerulonephritis) 4.History of GERD   Procedure (s):  Right Video-assisted Thoracoscopy for Bleb Resection, Apical Pleurectomy and Mechanical Pleurodesis by Dr. Cornelius Moras on 09/12/2012   History of Presenting Illness: This is a 22 year old female with Ehlers-Danlos syndrome and chronic kidney disease who was hospitalized one week ago with right spontaneous pneumothorax. She underwent chest tube placement by Dr. Donata Clay. The pneumothorax resolved and the chest tube was removed uneventfully 2 days later. The patient states that the morning of 09/12/2012 she developed sudden onset pleuritic right-sided chest pain and cough. She returned to the emergency department where chest x-ray revealed a recurrent right pneumothorax. She reports only mild shortness of breath. She otherwise feels well. She has no history of recent trauma nor respiratory infection. Dr. Cornelius Moras discussed with the patient and her mother the options of placing another chest tube or proceeding with surgery (right VATS). Potential risks, benefits, and complications were discussed. The patient ultimately requested surgical intervention. She underwent a right VATS, bleb resection, apical pleurectomy, and mechanical pleurodesis on 09/12/2012.   Brief Hospital Course:  She remained a febrile and hemodynamically stable.  Her chest x ray remained stable and there was no air leak from her chest  tube. Her foley and a line were removed post operative day one. Chest tube was placed to water seal on this day as well. She has had occasional nausea, likely related to narcotics. This did resolve. She has been tolerating a diet. She is ambulating on room air.Provided her chest x ray remains stable, the chest tube will be removed on 8/20. A follow up CXR will then be obtained. Provided she remains afebrile, hemodynamically stable, chest x ray remains stable, and pending morning round evaluation, she will be surgically stable for discharge on 09/16/2012.  Latest Vital Signs: Blood pressure 124/77, pulse 64, temperature 97.7 F (36.5 C), temperature source Oral, resp. rate 14, height 5\' 9"  (1.753 m), weight 56.2 kg (123 lb 14.4 oz), last menstrual period 09/05/2012, SpO2 98.00%.  Physical Exam:  Rhythm: sinus  Breath sounds: clear  Heart sounds: RRR  Incisions: Dressings dry  Abdomen: Soft, non-distended, non-tender  Extremities: Warm, well-perfused  Chest tube(s): No air leak, minimal output  Discharge Condition:Stable  Recent laboratory studies:  Lab Results  Component Value Date   WBC 7.9 18-Sep-2012   HGB 12.5 09/18/2012   HCT 36.8 18-Sep-2012   MCV 87.8 09-18-12   PLT 283 September 18, 2012   Lab Results  Component Value Date   NA 139 2012-09-18   K 4.0 09-18-12   CL 102 Sep 18, 2012   CO2 29 2012-09-18   CREATININE 1.25* 2012/09/18   GLUCOSE 95 Sep 18, 2012      Diagnostic Studies:   Dg Chest Port 1 View  2012/09/18   *RADIOLOGY REPORT*  Clinical Data: Recurrent spontaneous pneumothorax.  Ehlers Danlos syndrome.  PORTABLE CHEST - 1 VIEW  Comparison: 09/13/2012  Findings: Right chest tube remain in place.  No pneumothorax identified.  Surgical staples seen in  the right lung apex.  There has been resolution of atelectasis or infiltrate in the right lung base.  Lungs are now clear.  Heart size is normal.  IMPRESSION: No active disease.  No pneumothorax visualized.   Original Report Authenticated  By: Myles Rosenthal, M.D.     Discharge Medications:    Medication List         enalapril 2.5 MG tablet  Commonly known as:  VASOTEC  Take 2.5 mg by mouth at bedtime.     loratadine 10 MG tablet  Commonly known as:  CLARITIN  Take 10 mg by mouth daily as needed for allergies.     oxyCODONE-acetaminophen 5-325 MG per tablet  Commonly known as:  PERCOCET/ROXICET  Take 1-2 tablets by mouth every 4 (four) hours as needed.     polyethylene glycol packet  Commonly known as:  MIRALAX / GLYCOLAX  Take 17 g by mouth daily.     ranitidine 300 MG tablet  Commonly known as:  ZANTAC  Take 300 mg by mouth at bedtime.     traMADol 50 MG tablet  Commonly known as:  ULTRAM  Take 1 tablet (50 mg total) by mouth every 4 (four) hours as needed for pain.     tretinoin 0.05 % cream  Commonly known as:  RETIN-A  Apply topically at bedtime.     triamcinolone cream 0.1 %  Commonly known as:  KENALOG  Apply 1 application topically daily.     TYLENOL PO  Take 2 capsules by mouth every 6 (six) hours as needed (headaches).          Follow Up Appointments:     Follow-up Information   Follow up with Purcell Nails, MD. (PA/LAT CXR to be taken (at [facility] which is in the same building as Dr. Orvan July office) on 10/04/2012 at 12:30 pm; Appointment with Dr. Orvan July physician assistant is on 10/04/2012 at 1:30 pm)    Specialty:  Cardiothoracic Surgery   Contact information:   [address] [place] [address] [phone]       Signed: Doree Fudge MPA-C 09/14/2012, 10:58 AM

## 2012-09-14 NOTE — Progress Notes (Signed)
      [address]       [place] [address]             [phone]      CARDIOTHORACIC SURGERY PROGRESS NOTE  2 Days Post-Op  S/P Procedure(s) (LRB): VIDEO ASSISTED THORACOSCOPY/STAPLING OF BLEBS (Right)  Subjective: Feels well.  Slept well but still having pain with movement.  Objective: Vital signs in last 24 hours: Temp:  [97.3 F (36.3 C)-99 F (37.2 C)] 98 F (36.7 C) (08/19 0323) Pulse Rate:  [57-72] 64 (08/19 0323) Cardiac Rhythm:  [-] Normal sinus rhythm (08/18 2016) Resp:  [10-20] 14 (08/19 0329) BP: (94-140)/(63-90) 124/77 mmHg (08/19 0323) SpO2:  [93 %-99 %] 98 % (08/19 0329)  Physical Exam:  Rhythm:   sinus  Breath sounds: clear  Heart sounds:  RRR  Incisions:  Dressings dry  Abdomen:  Soft, non-distended, non-tender  Extremities:  Warm, well-perfused   Chest tube(s):  No air leak, minimal output   Intake/Output from previous day: 08/18 0701 - 08/19 0700 In: 1500 [I.V.:1500] Out: 1170 [Urine:900; Chest Tube:270] Intake/Output this shift:    Lab Results:  Recent Labs  09/13/12 0600 09/14/12 0450  WBC 9.3 7.9  HGB 12.2 12.5  HCT 34.4* 36.8  PLT 242 283   BMET:  Recent Labs  09/13/12 0600 09/14/12 0450  NA 135 139  K 4.3 4.0  CL 99 102  CO2 30 29  GLUCOSE 107* 95  BUN 14 10  CREATININE 1.31* 1.25*  CALCIUM 8.8 9.3    CBG (last 3)  No results found for this basename: GLUCAP,  in the last 72 hours  CXR:  Not done  Assessment/Plan: S/P Procedure(s) (LRB): VIDEO ASSISTED THORACOSCOPY/STAPLING OF BLEBS (Right)  Doing well POD2 No CXR done this morning Will place tube to water seal D/C tube tomorrow if CXR stable Mobilize  Denise [place] H 09/14/2012 8:07 AM

## 2012-09-14 NOTE — Progress Notes (Signed)
INITIAL NUTRITION ASSESSMENT  DOCUMENTATION CODES Per approved criteria  -Underweight   INTERVENTION: Add Resource Breeze po daily, each supplement provides 250 kcal and 9 grams of protein. Continue liberalized diet. RD to continue to follow nutrition care plan.  NUTRITION DIAGNOSIS: Inadequate oral intake related to nausea as evidenced by poor meal completion.   Goal: Intake to meet >90% of estimated nutrition needs.  Monitor:  weight trends, lab trends, I/O's, PO intake, supplement tolerance  Reason for Assessment: Health History  22 y.o. female  Admitting Dx: Recurrent spontaneous pneumothorax  ASSESSMENT: Past medical hx of Ehlers-Danlos syndrome and CKD; hospitalized last week with R spontaneous pneumothorax. She underwent chest tube placement. The pneumothorax resolved and the chest tube was removed uneventfully 2 days later. Admitted with sudden onset pleuritic right-sided chest pain and cough. She returned to the emergency department where chest x-ray reveals recurrent right pneumothorax.   Underwent the following 8/17: VIDEO ASSISTED THORACOSCOPY (RIGHT)  -STAPLING OF BLEBS  -PARIETAL PLEURECTOMY  -MECHANICAL PLEURODESIS  Pt is underweight, currently weighing 123 lb. Currently ordered for a Regular diet. Pt having difficulty choosing foods to eat for lunch 2/2 increased nausea. Agreeable to trying [employer]. Pt is at nutrition risk given chronic medical issues and underweight status.  Height: Ht Readings from Last 1 Encounters:  09/13/12 5\' 9"  (1.753 m)    Weight: Wt Readings from Last 1 Encounters:  09/13/12 123 lb 14.4 oz (56.2 kg)    Ideal Body Weight: 145 lb  % Ideal Body Weight: 85%  Wt Readings from Last 10 Encounters:  09/13/12 123 lb 14.4 oz (56.2 kg)  09/13/12 123 lb 14.4 oz (56.2 kg)  09/09/12 118 lb 9.6 oz (53.797 kg)    Usual Body Weight: n/a  % Usual Body Weight: n/a  BMI:  Body mass index is 18.29 kg/(m^2).  Underweight  Estimated Nutritional Needs: Kcal: 1700 - 2000 Protein: 60 - 70 g  Fluid: 1.7 - 2 liters  Skin: R chest incision  Diet Order: General  EDUCATION NEEDS: -No education needs identified at this time   Intake/Output Summary (Last 24 hours) at 09/14/12 1013 Last data filed at 09/14/12 0815  Gross per 24 hour  Intake   1275 ml  Output   1180 ml  Net     95 ml    Last BM: 8/13  Labs:   Recent Labs Lab 09/08/12 0445 09/12/12 1033 09/13/12 0600 09/14/12 0450  NA 138 140 135 139  K 3.9 4.6 4.3 4.0  CL 104 104 99 102  CO2 27  --  30 29  BUN 20 24* 14 10  CREATININE 1.41* 1.50* 1.31* 1.25*  CALCIUM 8.8  --  8.8 9.3  GLUCOSE 93 96 107* 95    CBG (last 3)  No results found for this basename: GLUCAP,  in the last 72 hours  Scheduled Meds: . bisacodyl  10 mg Oral Daily  . enalapril  2.5 mg Oral QHS  . famotidine  40 mg Oral QHS  . fentaNYL   Intravenous Q4H  . polyethylene glycol  17 g Oral Daily  . triamcinolone cream  1 application Topical Daily    Continuous Infusions: . dextrose 5 % and 0.45% NaCl 20 mL/hr at 09/14/12 1610    Past Medical History  Diagnosis Date  . Pneumothorax 09/06/2012    Right Side  . 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  Ehlers-Danlos syndrome 09/12/2012  . Gastroesophageal reflux 09/12/2012    Past Surgical History  Procedure Laterality Date  . Chest tube insertion Right 09/06/2012  . Renal biopsy, percutaneous    . Video assisted thoracoscopy Right 09/12/2012    Procedure: VIDEO ASSISTED THORACOSCOPY/STAPLING OF BLEBS;  Surgeon: Purcell Nails, MD;  Location: [facility] OR;  Service: Thoracic;  Laterality: Right;    Jarold Motto MS, RD, LDN Pager: [phone] After-hours pager: [phone]

## 2012-09-15 ENCOUNTER — Inpatient Hospital Stay (HOSPITAL_COMMUNITY): Payer: BC Managed Care – PPO

## 2012-09-15 NOTE — Progress Notes (Signed)
Removed Chest Tube per MD order. Tolerated removal well, VSS, -encouraged pt to use IS,   Cough and deep breathe. will continue to monitor  D/c'd PCA (fentanyl) per order-wasted 25ml with Osvaldo Human RN witnessing.

## 2012-09-15 NOTE — Progress Notes (Signed)
      [address]       [name] [address]             [phone]      CARDIOTHORACIC SURGERY PROGRESS NOTE  3 Days Post-Op  S/P Procedure(s) (LRB): VIDEO ASSISTED THORACOSCOPY/STAPLING OF BLEBS (Right)  Subjective: Feels better.  No complaints.  Objective: Vital signs in last 24 hours: Temp:  [97.5 F (36.4 C)-98.8 F (37.1 C)] 98.2 F (36.8 C) (08/20 0711) Pulse Rate:  [64-81] 68 (08/20 0340) Cardiac Rhythm:  [-] Normal sinus rhythm (08/20 0816) Resp:  [11-20] 11 (08/20 0340) BP: (104-122)/(51-72) 114/72 mmHg (08/20 0710) SpO2:  [95 %-98 %] 98 % (08/20 0340)  Physical Exam:  Rhythm:   sinus  Breath sounds: clear  Heart sounds:  RRR  Incisions:  Clean and dry  Abdomen:  soft  Extremities:  warm  Chest tube(s):  No air leak   Intake/Output from previous day: 08/19 0701 - 08/20 0700 In: 750.7 [I.V.:750.7] Out: 560 [Urine:500; Chest Tube:60] Intake/Output this shift: Total I/O In: 20 [I.V.:20] Out: -   Lab Results:  Recent Labs  09/13/12 0600 09/14/12 0450  WBC 9.3 7.9  HGB 12.2 12.5  HCT 34.4* 36.8  PLT 242 283   BMET:  Recent Labs  09/13/12 0600 09/14/12 0450  NA 135 139  K 4.3 4.0  CL 99 102  CO2 30 29  GLUCOSE 107* 95  BUN 14 10  CREATININE 1.31* 1.25*  CALCIUM 8.8 9.3    CBG (last 3)  No results found for this basename: GLUCAP,  in the last 72 hours  CXR:  *RADIOLOGY REPORT*  Clinical Data: Chest tube  CHEST - 2 VIEW  Comparison: 09/14/2012  Findings: Right chest tube in place with postop staples in the  right apex. Negative for pneumothorax.  The lungs are clear without infiltrate or effusion. Right apical  emphysema noted.  IMPRESSION:  Negative for pneumothorax. Lungs are clear.  Original Report Authenticated By: Janeece Riggers, M.D.   Assessment/Plan: S/P Procedure(s) (LRB): VIDEO ASSISTED THORACOSCOPY/STAPLING OF BLEBS (Right)  Doing well D/C chest tube D/C PCA Tentatively plan D/C home tomorrow F/U  in office w/ CXR in 2 weeks  Denise Compton H 09/15/2012 8:38 AM

## 2012-09-16 ENCOUNTER — Inpatient Hospital Stay (HOSPITAL_COMMUNITY): Payer: BC Managed Care – PPO

## 2012-09-16 MED ORDER — OXYCODONE-ACETAMINOPHEN 5-325 MG PO TABS: 1.0000 | ORAL_TABLET | ORAL | Status: DC | PRN

## 2012-09-16 NOTE — Progress Notes (Signed)
Discharge instructions given to patient and mother with teach-back.  No questions.  Discharged home.  No complaints

## 2012-09-16 NOTE — Progress Notes (Signed)
      [address]       [place] [address]             [phone]      4 Days Post-Op Procedure(s) (LRB): VIDEO ASSISTED THORACOSCOPY/STAPLING OF BLEBS (Right)  Subjective:  Denise Compton has no complaints this morning.  Her pain is controlled with use of oral medications.  She is ready to be discharged  Objective: Vital signs in last 24 hours: Temp:  [98.1 F (36.7 C)-98.7 F (37.1 C)] 98.4 F (36.9 C) (08/21 0717) Pulse Rate:  [71-92] 76 (08/21 0718) Cardiac Rhythm:  [-] Normal sinus rhythm (08/21 0800) Resp:  [12-16] 14 (08/21 0718) BP: (117-131)/(71-85) 119/74 mmHg (08/21 0716) SpO2:  [97 %-100 %] 99 % (08/21 0718) Intake/Output from previous day: 08/20 0701 - 08/21 0700 In: 620 [P.O.:600; I.V.:20] Out: -   General appearance: alert, cooperative and no distress Neurologic: intact Heart: regular rate and rhythm Lungs: clear to auscultation bilaterally Abdomen: soft, non-tender; bowel sounds normal; no masses,  no organomegaly Extremities: extremities normal, atraumatic, no cyanosis or edema Wound: clean and dry  Lab Results:  Recent Labs  09/14/12 0450  WBC 7.9  HGB 12.5  HCT 36.8  PLT 283   BMET:  Recent Labs  09/14/12 0450  NA 139  K 4.0  CL 102  CO2 29  GLUCOSE 95  BUN 10  CREATININE 1.25*  CALCIUM 9.3    PT/INR: No results found for this basename: LABPROT, INR,  in the last 72 hours ABG    Component Value Date/Time   PHART 7.377 09/13/2012 0435   HCO3 27.2* 09/13/2012 0435   TCO2 28.6 09/13/2012 0435   O2SAT 99.5 09/13/2012 0435   CBG (last 3)  No results found for this basename: GLUCAP,  in the last 72 hours  Assessment/Plan: S/P Procedure(s) (LRB): VIDEO ASSISTED THORACOSCOPY/STAPLING OF BLEBS (Right)  1. CXR- no evidence of pneumothorax 2. Pain control- well controlled with oral medication 3. Dispo- d/c patient home today   LOS: 4 days    Raford Pitcher, Denise Compton 09/16/2012

## 2012-09-22 ENCOUNTER — Ambulatory Visit: Payer: BC Managed Care – PPO | Admitting: Cardiothoracic Surgery

## 2012-09-30 ENCOUNTER — Ambulatory Visit
Admission: RE | Admit: 2012-09-30 | Discharge: 2012-09-30 | Disposition: A | Discharge status: Home / Self Care | Payer: BC Managed Care – PPO | Source: Ambulatory Visit | Attending: Thoracic Surgery (Cardiothoracic Vascular Surgery) | Admitting: Thoracic Surgery (Cardiothoracic Vascular Surgery)

## 2012-09-30 ENCOUNTER — Other Ambulatory Visit: Payer: Self-pay | Admitting: *Deleted

## 2012-09-30 DIAGNOSIS — J939 Pneumothorax, unspecified: Secondary | ICD-10-CM

## 2012-10-04 ENCOUNTER — Ambulatory Visit (INDEPENDENT_AMBULATORY_CARE_PROVIDER_SITE_OTHER): Payer: Self-pay | Admitting: Physician Assistant

## 2012-10-04 ENCOUNTER — Ambulatory Visit: Payer: BC Managed Care – PPO

## 2012-10-04 VITALS — BP 130/90 | HR 78 | Resp 18 | Ht 69.0 in | Wt 123.0 lb

## 2012-10-04 DIAGNOSIS — Z9889 Other specified postprocedural states: Secondary | ICD-10-CM

## 2012-10-04 DIAGNOSIS — Z09 Encounter for follow-up examination after completed treatment for conditions other than malignant neoplasm: Secondary | ICD-10-CM

## 2012-10-04 DIAGNOSIS — J9383 Other pneumothorax: Secondary | ICD-10-CM

## 2012-10-04 MED ORDER — TRAMADOL HCL 50 MG PO TABS: 50.0000 mg | ORAL_TABLET | ORAL | Status: AC | PRN

## 2012-10-04 NOTE — Progress Notes (Signed)
  HPI: Patient returns for routine postoperative follow-up having undergone Right VATs with stapling of Bleb for Recurrent Pneumothorax on 09/12/2012. The patient's early postoperative recovery while in the hospital was unremarkable.  Since hospital discharge the patient reports that she is doing well.  She returned to work today and has tolerated the day well.  She does continue to have some pain along her right chest wall that she states comes and goes.  She states this has improved since hospital discharge  She uses Ultram which does provide relief.  She questions if she is able to start working out again.     Current Outpatient Prescriptions  Medication Sig Dispense Refill  . Acetaminophen (TYLENOL PO) Take 2 capsules by mouth every 6 (six) hours as needed (headaches).      . enalapril (VASOTEC) 2.5 MG tablet Take 5 mg by mouth at bedtime.       Marland Kitchen loratadine (CLARITIN) 10 MG tablet Take 10 mg by mouth daily as needed for allergies.      . polyethylene glycol (MIRALAX / GLYCOLAX) packet Take 17 g by mouth daily.      . ranitidine (ZANTAC) 300 MG tablet Take 300 mg by mouth at bedtime.      . traMADol (ULTRAM) 50 MG tablet Take 1 tablet (50 mg total) by mouth every 4 (four) hours as needed for pain.  40 tablet  0  . tretinoin (RETIN-A) 0.05 % cream Apply topically at bedtime.      . triamcinolone cream (KENALOG) 0.1 % Apply 1 application topically daily.       No current facility-administered medications for this visit.    Physical Exam:  BP 130/90  Pulse 78  Resp 18  Ht 5\' 9"  (1.753 m)  Wt 123 lb (55.792 kg)  BMI 18.16 kg/m2  SpO2 96%  LMP 09/05/2012  Gen: no apparent distress Heart: RRR Lungs: CTA bilaterally Skin: incisions well healed, suture remains in place at chest tube site  Diagnostic Tests:  CXR: No evidence of pneumothorax or pleural effusion  A/P:  1. S/P Right VATS for recurrent Pneumothorax- doing very well 2. Residual Incisional pain- which should continue to  improve, will give refill on Ultram 3. Resume activity level as tolerated 4. RTC prn, patient was instructed to report to Emergency Department should she develop symptoms similar to her previous pneumothorax

## 2012-12-02 ENCOUNTER — Other Ambulatory Visit: Payer: Self-pay

## 2014-05-18 IMAGING — CR DG CHEST 1V PORT
1 series · 1 of 1 positions shown · non-contrast
Comparison: Portable exam 5125 hours compared to 09/12/2012

CLINICAL DATA: Post VATS and stapling of apical bleb

PORTABLE CHEST - 1 VIEW

[AP]
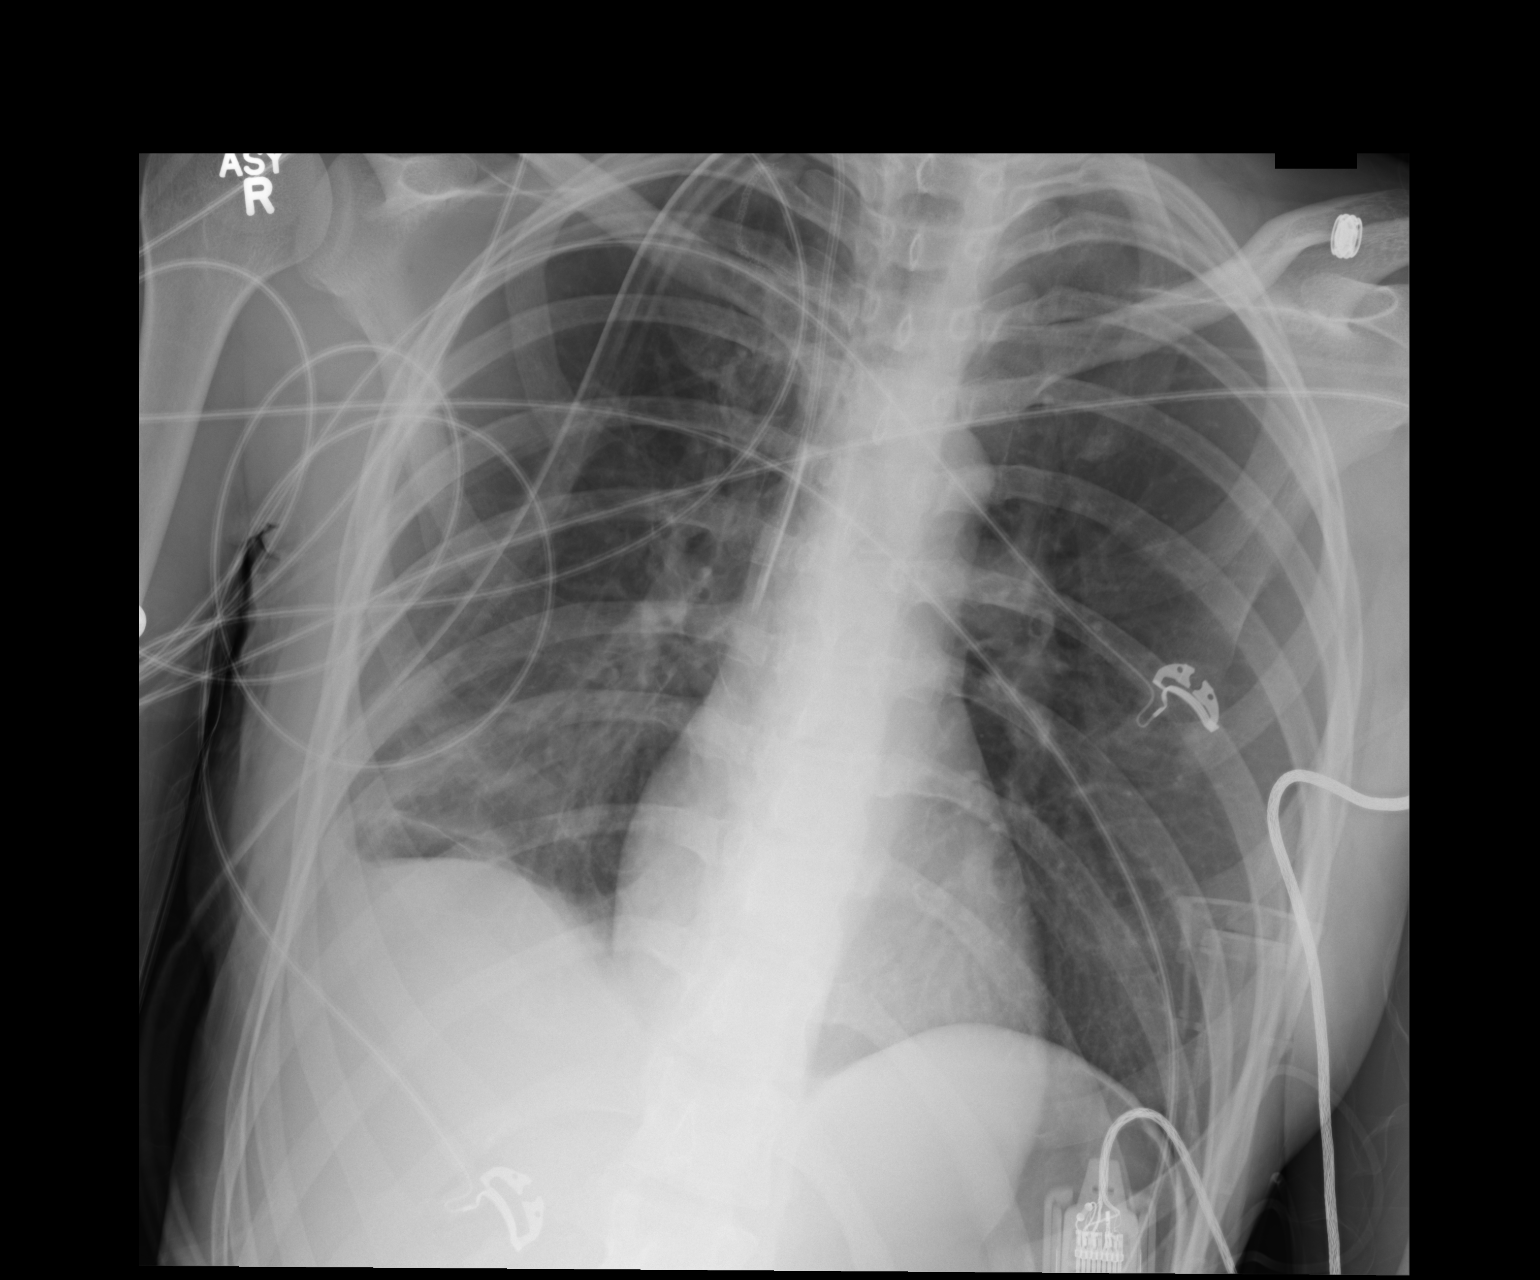

[1 of 1 positions shown; findings below may reference images not displayed]

FINDINGS: Right jugular central venous catheter tip projects over SVC.
Right thoracostomy tube present with tip at apex.
Normal heart size, mediastinal contours, and pulmonary vascularity.
Staple line at right apex post resection.
Hyperinflated lungs with right basilar atelectasis.
No definite pneumothorax identified.
Bones unremarkable.
IMPRESSION: Emphysematous changes with persistent right basilar atelectasis.
Right thoracostomy tube without definite pneumothorax.

## 2014-05-19 IMAGING — CR DG CHEST 1V PORT
1 series · 1 of 1 positions shown · non-contrast
Comparison: 09/13/2012

CLINICAL DATA: Recurrent spontaneous pneumothorax.  Ehlers Danlos
syndrome.

PORTABLE CHEST - 1 VIEW

[AP]
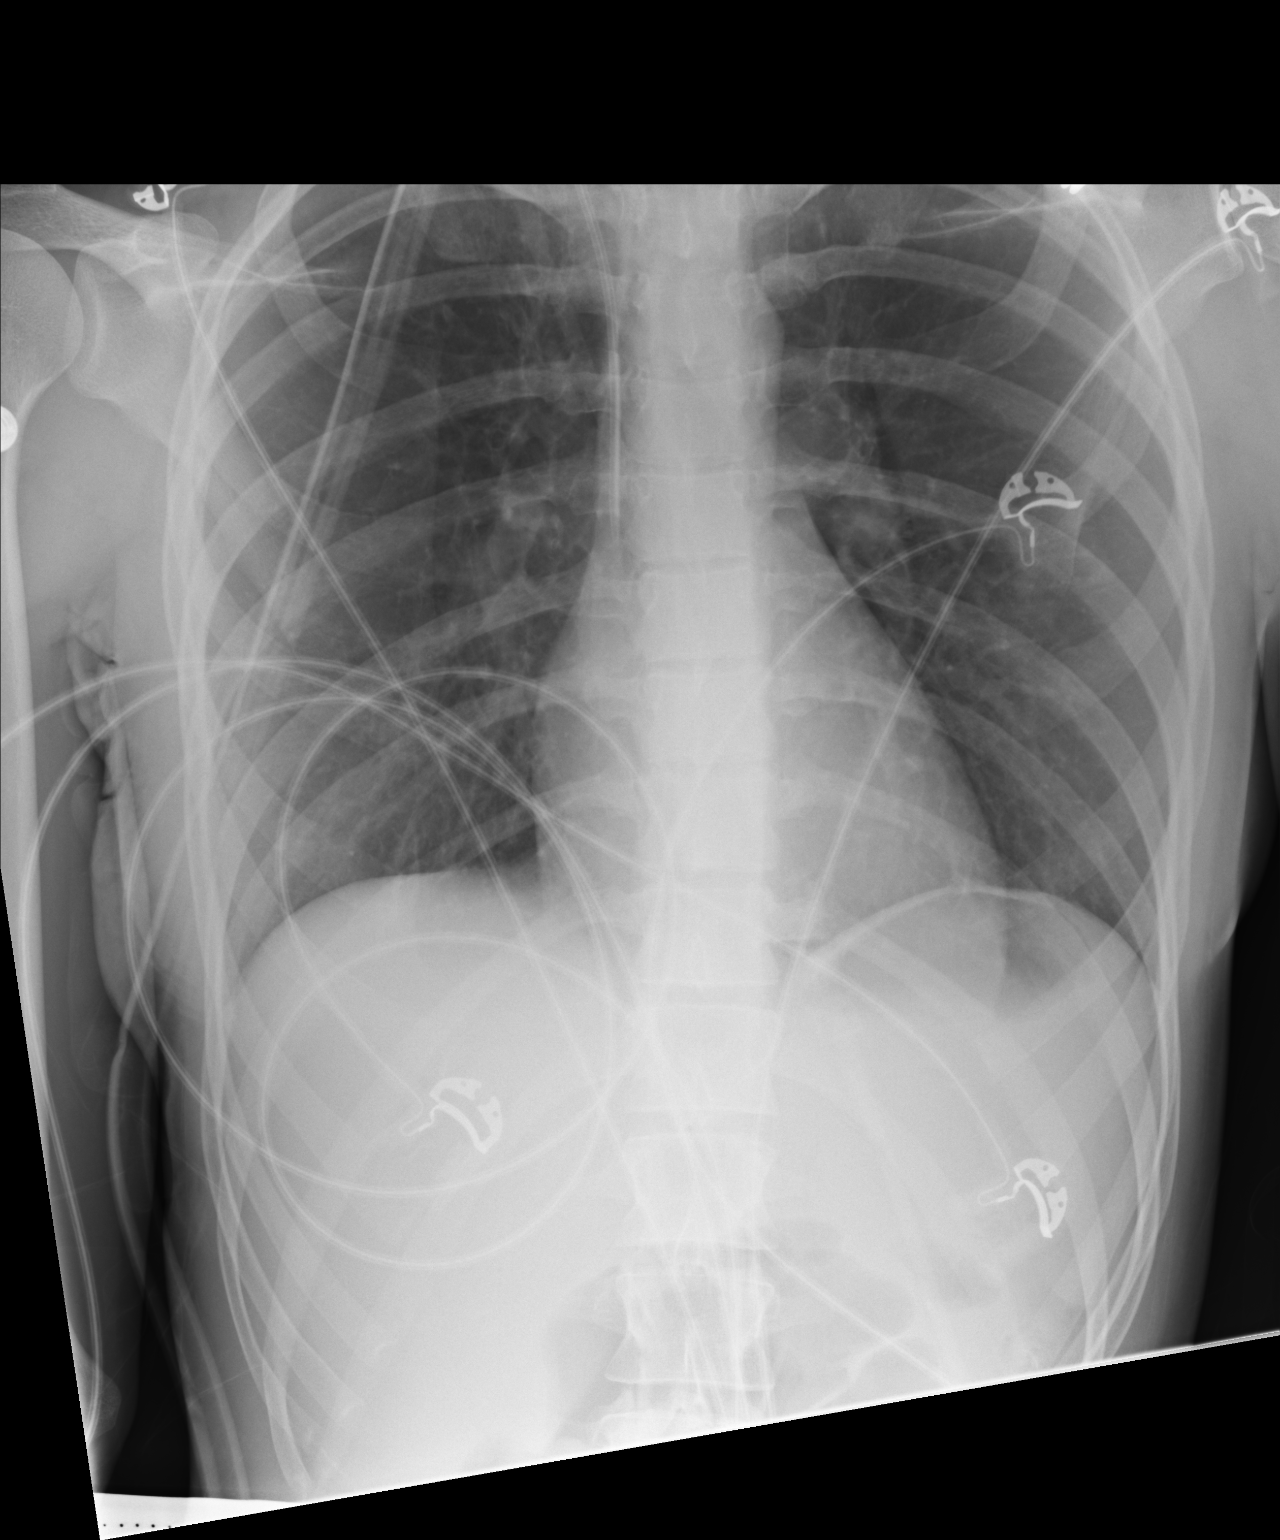

[1 of 1 positions shown; findings below may reference images not displayed]

FINDINGS: Right chest tube remain in place.  No pneumothorax
identified.  Surgical staples seen in the right lung apex.

There has been resolution of atelectasis or infiltrate in the right
lung base.  Lungs are now clear.  Heart size is normal.
IMPRESSION: No active disease.  No pneumothorax visualized.

## 2014-05-20 IMAGING — CR DG CHEST 2V
2 series · 2 of 2 positions shown · non-contrast
Comparison: 09/14/2012

CLINICAL DATA: Chest tube

CHEST - 2 VIEW

[w chest pa]
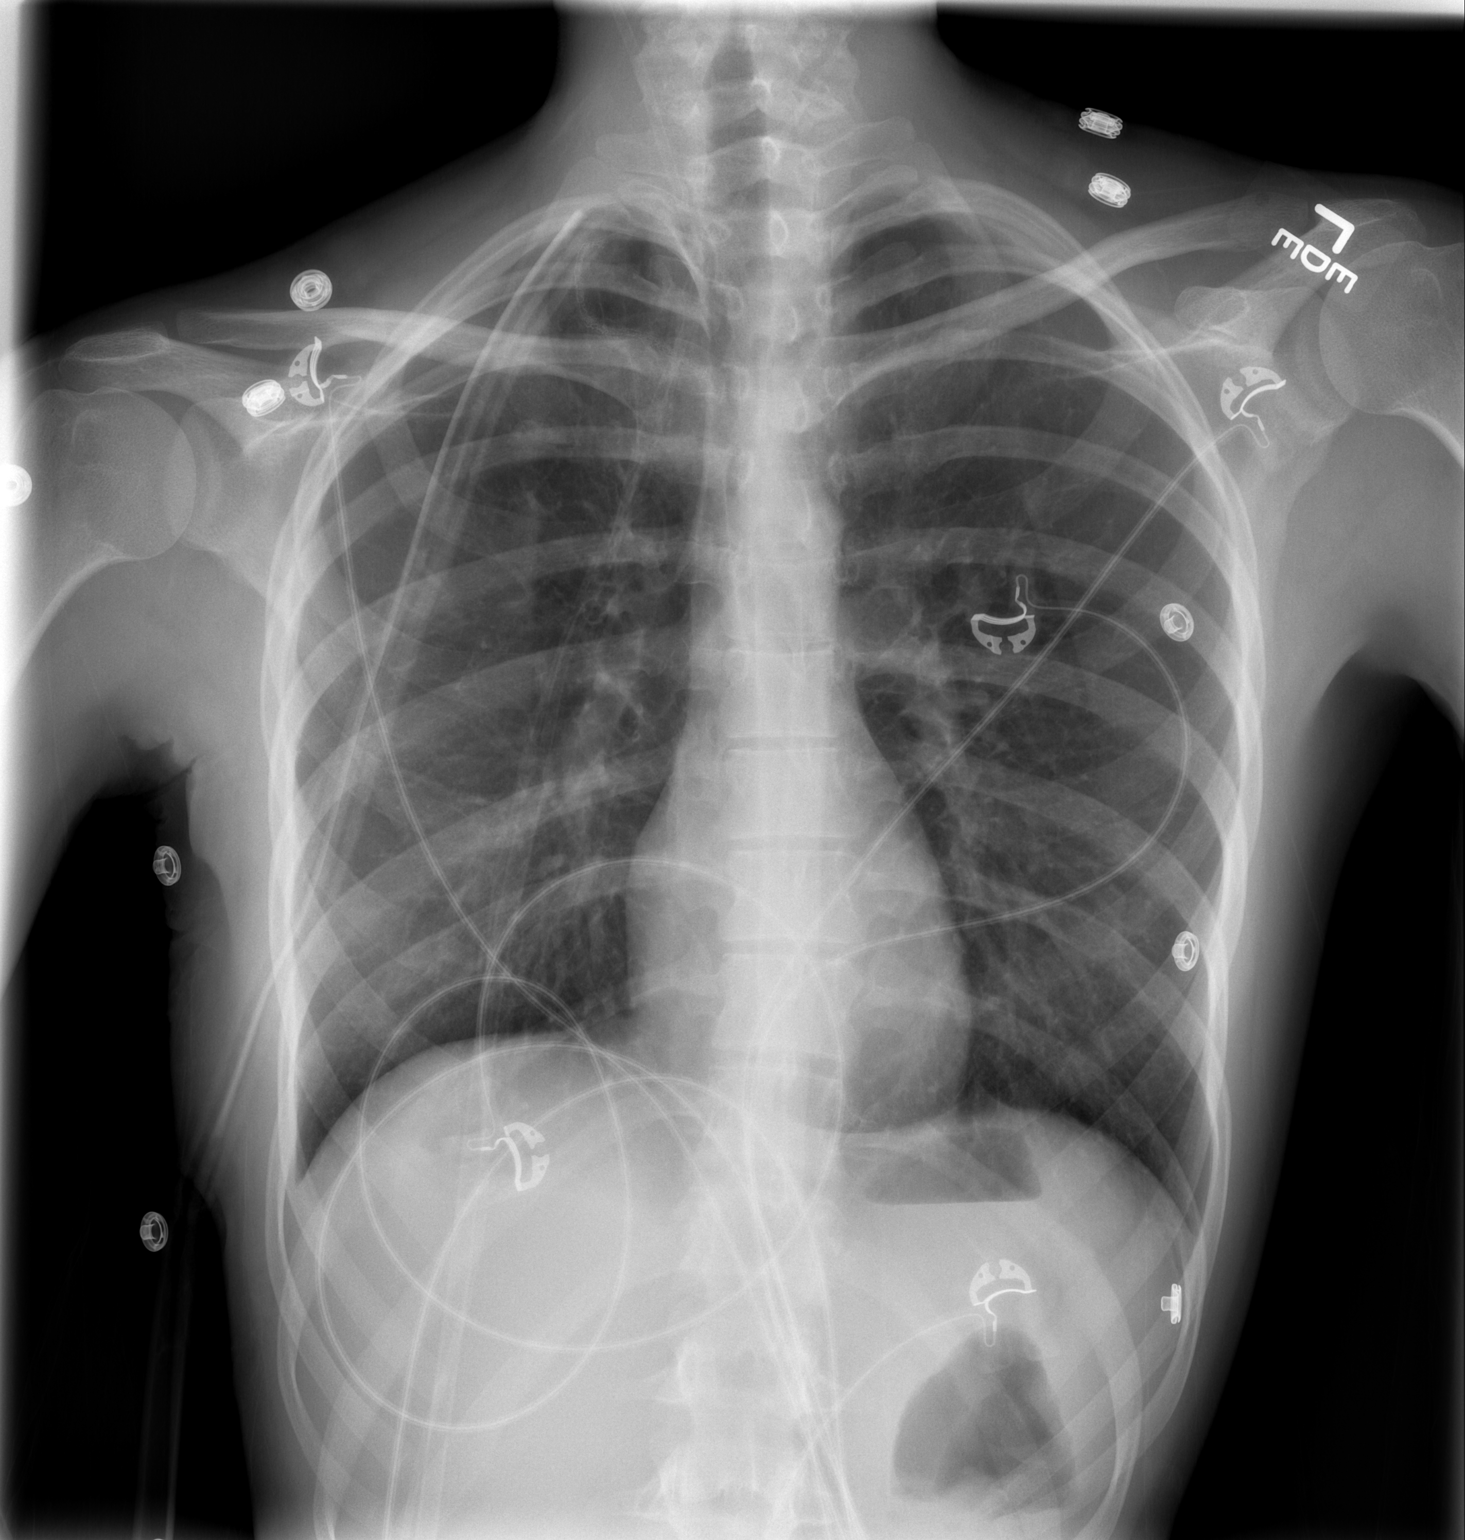

[w chest lat]
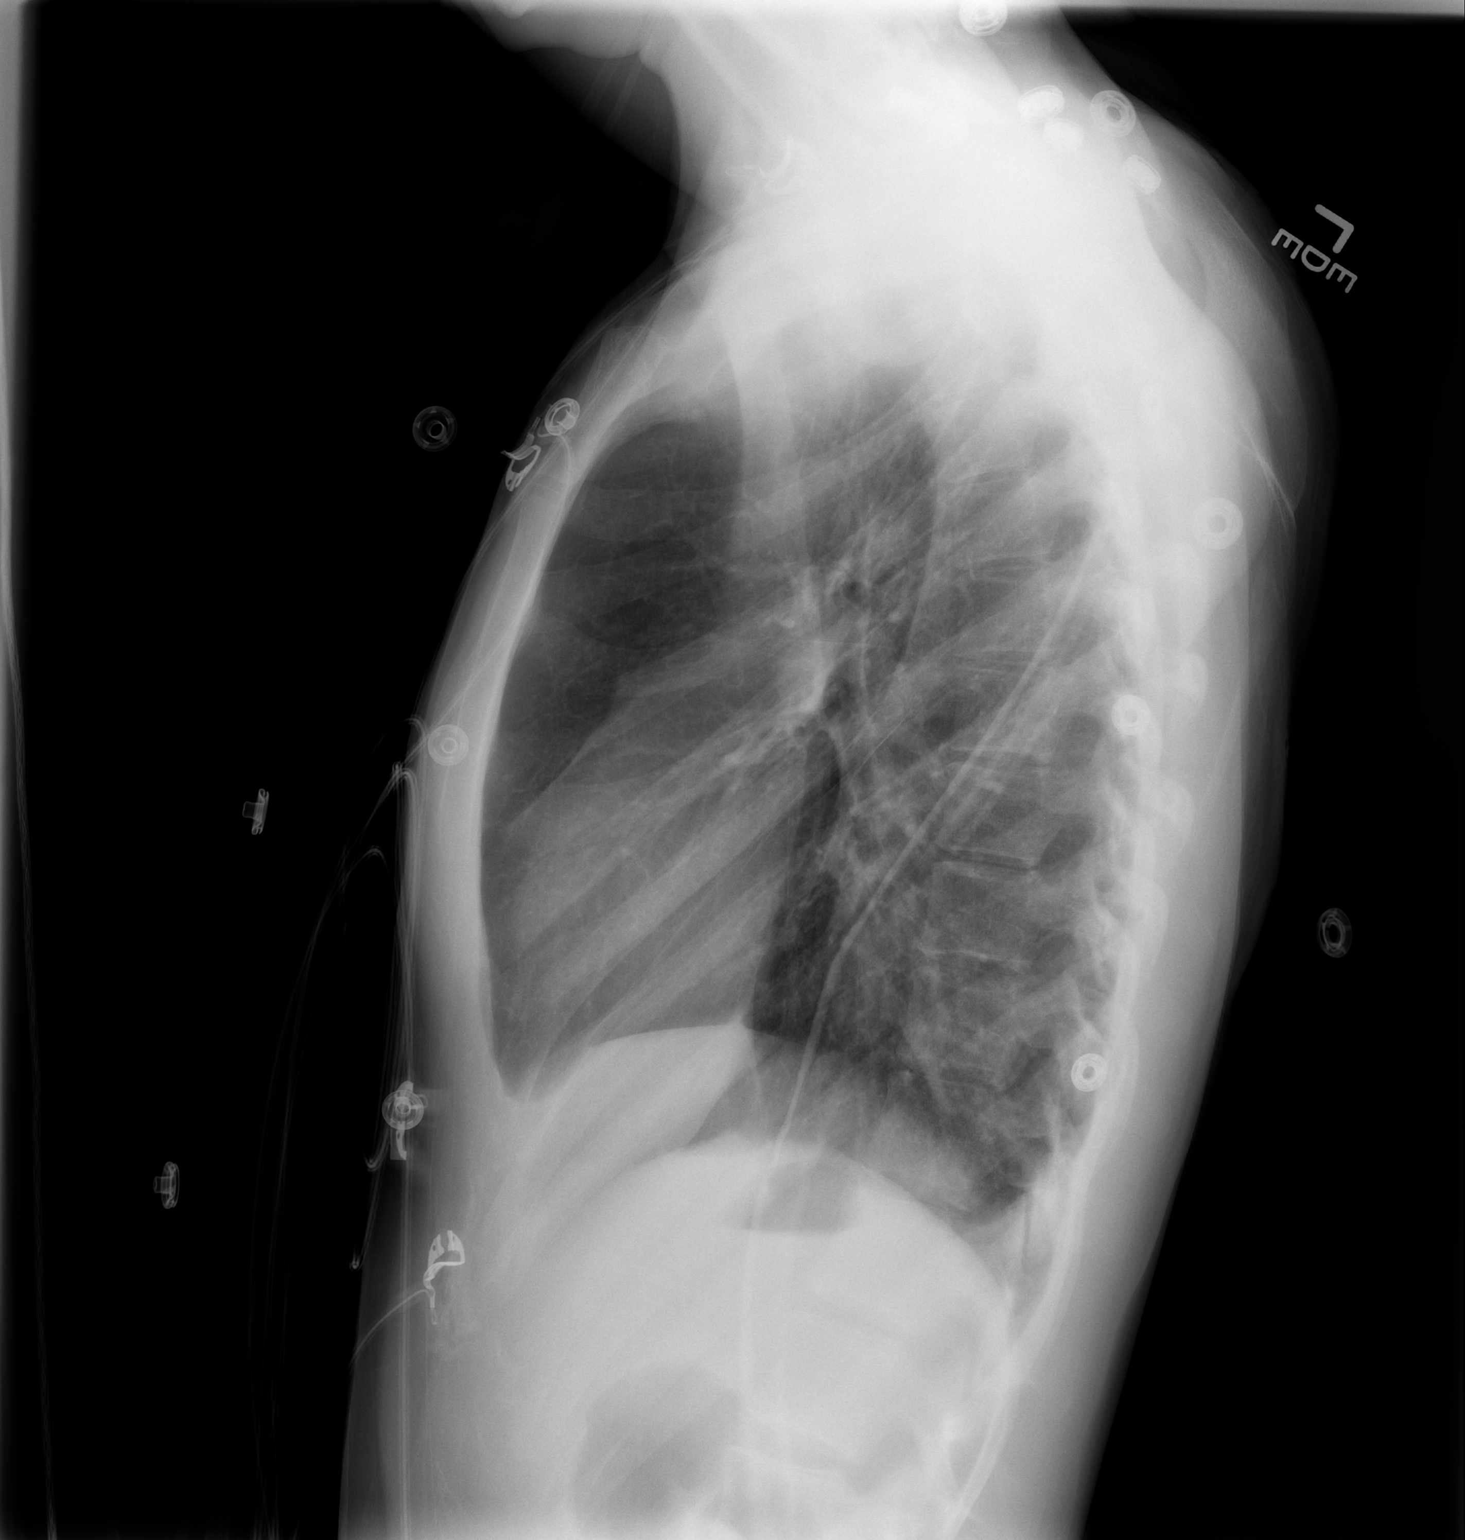

[2 of 2 positions shown; findings below may reference images not displayed]

FINDINGS: Right chest tube in place with postop staples in the
right apex.  Negative for pneumothorax.

The lungs are clear without infiltrate or effusion.  Right apical
emphysema noted.
IMPRESSION: Negative for pneumothorax.  Lungs are clear.

## 2014-05-20 IMAGING — CR DG CHEST 1V PORT
1 series · 1 of 1 positions shown · non-contrast
Comparison: 09/15/2012

CLINICAL DATA: Rule out pneumothorax.  Chest tube removal

PORTABLE CHEST - 1 VIEW

[AP]
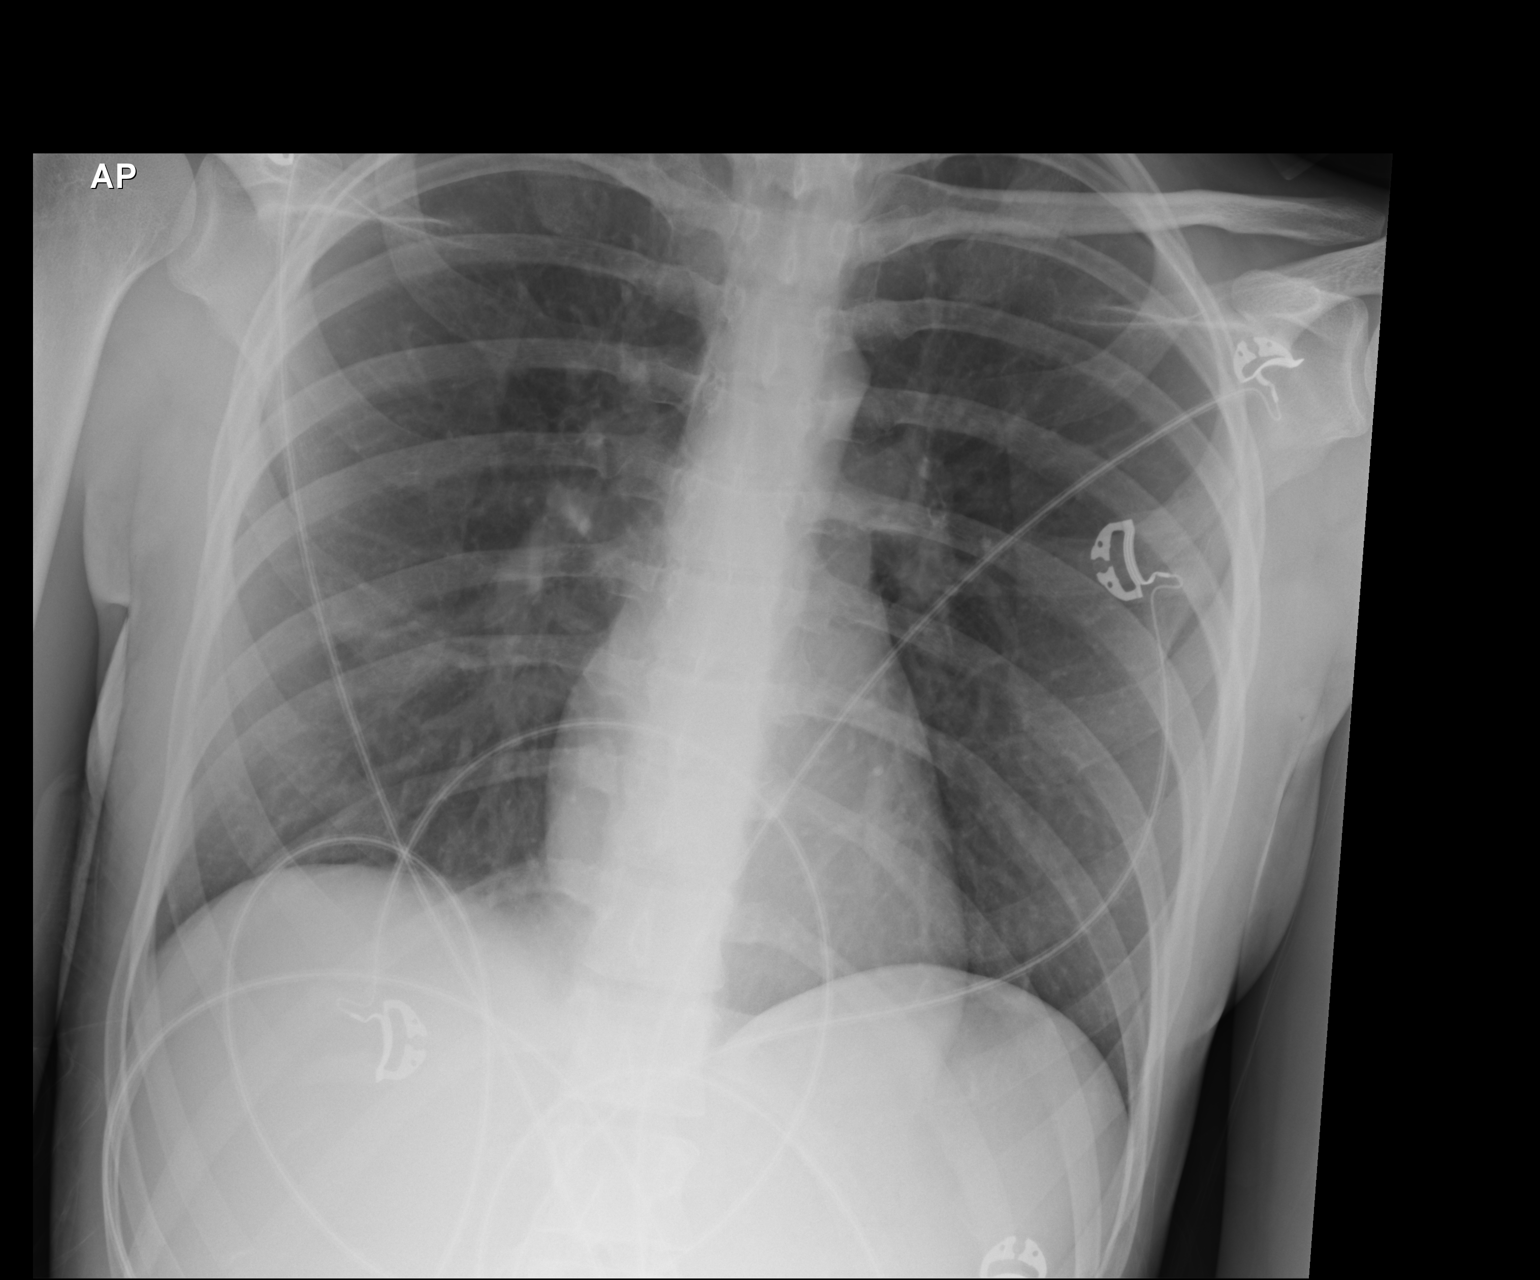

[1 of 1 positions shown; findings below may reference images not displayed]

FINDINGS: Right chest tube has been removed.  No pneumothorax.
Surgical clips in the right apex

The lungs remain clear.
IMPRESSION: Negative for pneumothorax post chest tube removal.

## 2014-06-30 ENCOUNTER — Other Ambulatory Visit: Payer: Self-pay

## 2014-07-03 LAB — CYTOLOGY - PAP

## 2015-05-09 DIAGNOSIS — F419 Anxiety disorder, unspecified: Secondary | ICD-10-CM | POA: Diagnosis not present

## 2015-05-17 DIAGNOSIS — F419 Anxiety disorder, unspecified: Secondary | ICD-10-CM | POA: Diagnosis not present

## 2015-05-23 DIAGNOSIS — F419 Anxiety disorder, unspecified: Secondary | ICD-10-CM | POA: Diagnosis not present

## 2015-06-06 DIAGNOSIS — F419 Anxiety disorder, unspecified: Secondary | ICD-10-CM | POA: Diagnosis not present

## 2015-07-27 DIAGNOSIS — Z01419 Encounter for gynecological examination (general) (routine) without abnormal findings: Secondary | ICD-10-CM | POA: Diagnosis not present

## 2015-08-23 DIAGNOSIS — N269 Renal sclerosis, unspecified: Secondary | ICD-10-CM | POA: Diagnosis not present

## 2015-09-29 DIAGNOSIS — Z23 Encounter for immunization: Secondary | ICD-10-CM | POA: Diagnosis not present

## 2015-10-03 DIAGNOSIS — N269 Renal sclerosis, unspecified: Secondary | ICD-10-CM | POA: Diagnosis not present

## 2015-10-24 DIAGNOSIS — N269 Renal sclerosis, unspecified: Secondary | ICD-10-CM | POA: Diagnosis not present

## 2015-10-26 DIAGNOSIS — N269 Renal sclerosis, unspecified: Secondary | ICD-10-CM | POA: Diagnosis not present

## 2015-11-28 DIAGNOSIS — M25562 Pain in left knee: Secondary | ICD-10-CM | POA: Diagnosis not present

## 2015-11-28 DIAGNOSIS — G8929 Other chronic pain: Secondary | ICD-10-CM | POA: Diagnosis not present

## 2015-12-04 DIAGNOSIS — M25562 Pain in left knee: Secondary | ICD-10-CM | POA: Diagnosis not present

## 2015-12-04 DIAGNOSIS — G8929 Other chronic pain: Secondary | ICD-10-CM | POA: Diagnosis not present

## 2016-01-01 DIAGNOSIS — R634 Abnormal weight loss: Secondary | ICD-10-CM | POA: Diagnosis not present

## 2016-01-01 DIAGNOSIS — Z0001 Encounter for general adult medical examination with abnormal findings: Secondary | ICD-10-CM | POA: Diagnosis not present

## 2016-02-01 DIAGNOSIS — Z006 Encounter for examination for normal comparison and control in clinical research program: Secondary | ICD-10-CM | POA: Diagnosis not present

## 2016-04-07 DIAGNOSIS — N269 Renal sclerosis, unspecified: Secondary | ICD-10-CM | POA: Diagnosis not present

## 2016-04-29 DIAGNOSIS — S70362A Insect bite (nonvenomous), left thigh, initial encounter: Secondary | ICD-10-CM | POA: Diagnosis not present

## 2016-06-18 DIAGNOSIS — R350 Frequency of micturition: Secondary | ICD-10-CM | POA: Diagnosis not present

## 2016-06-18 DIAGNOSIS — N390 Urinary tract infection, site not specified: Secondary | ICD-10-CM | POA: Diagnosis not present

## 2016-08-04 DIAGNOSIS — Z01419 Encounter for gynecological examination (general) (routine) without abnormal findings: Secondary | ICD-10-CM | POA: Diagnosis not present

## 2016-08-04 DIAGNOSIS — Z681 Body mass index (BMI) 19 or less, adult: Secondary | ICD-10-CM | POA: Diagnosis not present

## 2016-09-27 DIAGNOSIS — Z23 Encounter for immunization: Secondary | ICD-10-CM | POA: Diagnosis not present

## 2016-10-20 DIAGNOSIS — N269 Renal sclerosis, unspecified: Secondary | ICD-10-CM | POA: Diagnosis not present

## 2016-11-03 DIAGNOSIS — N269 Renal sclerosis, unspecified: Secondary | ICD-10-CM | POA: Diagnosis not present

## 2016-11-03 DIAGNOSIS — G600 Hereditary motor and sensory neuropathy: Secondary | ICD-10-CM | POA: Diagnosis not present

## 2016-11-05 DIAGNOSIS — N269 Renal sclerosis, unspecified: Secondary | ICD-10-CM | POA: Diagnosis not present

## 2016-11-18 DIAGNOSIS — N051 Unspecified nephritic syndrome with focal and segmental glomerular lesions: Secondary | ICD-10-CM | POA: Diagnosis not present

## 2017-02-19 DIAGNOSIS — Z0000 Encounter for general adult medical examination without abnormal findings: Secondary | ICD-10-CM | POA: Diagnosis not present

## 2017-02-19 DIAGNOSIS — K5939 Other megacolon: Secondary | ICD-10-CM | POA: Diagnosis not present

## 2017-02-19 DIAGNOSIS — Q796 Ehlers-Danlos syndrome: Secondary | ICD-10-CM | POA: Diagnosis not present

## 2017-02-19 DIAGNOSIS — Z23 Encounter for immunization: Secondary | ICD-10-CM | POA: Diagnosis not present

## 2017-02-19 DIAGNOSIS — K219 Gastro-esophageal reflux disease without esophagitis: Secondary | ICD-10-CM | POA: Diagnosis not present

## 2017-02-19 DIAGNOSIS — G43009 Migraine without aura, not intractable, without status migrainosus: Secondary | ICD-10-CM | POA: Diagnosis not present

## 2017-04-02 DIAGNOSIS — J069 Acute upper respiratory infection, unspecified: Secondary | ICD-10-CM | POA: Diagnosis not present

## 2017-06-03 DIAGNOSIS — Q796 Ehlers-Danlos syndrome: Secondary | ICD-10-CM | POA: Diagnosis not present

## 2017-06-03 DIAGNOSIS — Z01818 Encounter for other preprocedural examination: Secondary | ICD-10-CM | POA: Diagnosis not present

## 2017-06-03 DIAGNOSIS — G600 Hereditary motor and sensory neuropathy: Secondary | ICD-10-CM | POA: Diagnosis not present

## 2017-06-03 DIAGNOSIS — N186 End stage renal disease: Secondary | ICD-10-CM | POA: Diagnosis not present

## 2017-06-03 DIAGNOSIS — Z01812 Encounter for preprocedural laboratory examination: Secondary | ICD-10-CM | POA: Diagnosis not present

## 2017-06-03 DIAGNOSIS — K219 Gastro-esophageal reflux disease without esophagitis: Secondary | ICD-10-CM | POA: Diagnosis not present

## 2017-06-03 DIAGNOSIS — Z79899 Other long term (current) drug therapy: Secondary | ICD-10-CM | POA: Diagnosis not present

## 2017-06-03 DIAGNOSIS — Z114 Encounter for screening for human immunodeficiency virus [HIV]: Secondary | ICD-10-CM | POA: Diagnosis not present

## 2017-06-03 DIAGNOSIS — I120 Hypertensive chronic kidney disease with stage 5 chronic kidney disease or end stage renal disease: Secondary | ICD-10-CM | POA: Diagnosis not present

## 2017-06-03 DIAGNOSIS — Z006 Encounter for examination for normal comparison and control in clinical research program: Secondary | ICD-10-CM | POA: Diagnosis not present

## 2017-07-08 DIAGNOSIS — N281 Cyst of kidney, acquired: Secondary | ICD-10-CM | POA: Diagnosis not present

## 2017-07-08 DIAGNOSIS — N186 End stage renal disease: Secondary | ICD-10-CM | POA: Diagnosis not present

## 2017-07-08 DIAGNOSIS — Z681 Body mass index (BMI) 19 or less, adult: Secondary | ICD-10-CM | POA: Diagnosis not present

## 2017-07-08 DIAGNOSIS — N185 Chronic kidney disease, stage 5: Secondary | ICD-10-CM | POA: Diagnosis not present

## 2017-07-08 DIAGNOSIS — Z01818 Encounter for other preprocedural examination: Secondary | ICD-10-CM | POA: Diagnosis not present

## 2017-07-08 DIAGNOSIS — N1330 Unspecified hydronephrosis: Secondary | ICD-10-CM | POA: Diagnosis not present

## 2017-07-20 DIAGNOSIS — Z681 Body mass index (BMI) 19 or less, adult: Secondary | ICD-10-CM | POA: Diagnosis not present

## 2017-07-20 DIAGNOSIS — Z01810 Encounter for preprocedural cardiovascular examination: Secondary | ICD-10-CM | POA: Diagnosis not present

## 2017-07-20 DIAGNOSIS — Z7682 Awaiting organ transplant status: Secondary | ICD-10-CM | POA: Diagnosis not present

## 2017-07-20 DIAGNOSIS — N051 Unspecified nephritic syndrome with focal and segmental glomerular lesions: Secondary | ICD-10-CM | POA: Diagnosis not present

## 2017-07-20 DIAGNOSIS — N184 Chronic kidney disease, stage 4 (severe): Secondary | ICD-10-CM | POA: Diagnosis not present

## 2017-07-20 DIAGNOSIS — Z01818 Encounter for other preprocedural examination: Secondary | ICD-10-CM | POA: Diagnosis not present

## 2017-07-20 DIAGNOSIS — Z006 Encounter for examination for normal comparison and control in clinical research program: Secondary | ICD-10-CM | POA: Diagnosis not present

## 2017-08-03 DIAGNOSIS — R809 Proteinuria, unspecified: Secondary | ICD-10-CM | POA: Diagnosis not present

## 2017-08-07 DIAGNOSIS — Z01419 Encounter for gynecological examination (general) (routine) without abnormal findings: Secondary | ICD-10-CM | POA: Diagnosis not present

## 2017-08-07 DIAGNOSIS — Z681 Body mass index (BMI) 19 or less, adult: Secondary | ICD-10-CM | POA: Diagnosis not present

## 2017-08-10 DIAGNOSIS — N269 Renal sclerosis, unspecified: Secondary | ICD-10-CM | POA: Diagnosis not present

## 2017-08-10 DIAGNOSIS — N1330 Unspecified hydronephrosis: Secondary | ICD-10-CM | POA: Diagnosis not present

## 2017-08-18 DIAGNOSIS — N1330 Unspecified hydronephrosis: Secondary | ICD-10-CM | POA: Diagnosis not present

## 2017-08-24 DIAGNOSIS — N189 Chronic kidney disease, unspecified: Secondary | ICD-10-CM | POA: Diagnosis not present

## 2017-08-24 DIAGNOSIS — F419 Anxiety disorder, unspecified: Secondary | ICD-10-CM | POA: Diagnosis not present

## 2017-08-24 DIAGNOSIS — G600 Hereditary motor and sensory neuropathy: Secondary | ICD-10-CM | POA: Diagnosis not present

## 2017-08-24 DIAGNOSIS — I129 Hypertensive chronic kidney disease with stage 1 through stage 4 chronic kidney disease, or unspecified chronic kidney disease: Secondary | ICD-10-CM | POA: Diagnosis not present

## 2017-08-24 DIAGNOSIS — Z01818 Encounter for other preprocedural examination: Secondary | ICD-10-CM | POA: Diagnosis not present

## 2017-09-02 DIAGNOSIS — Z01818 Encounter for other preprocedural examination: Secondary | ICD-10-CM | POA: Diagnosis not present

## 2017-09-02 DIAGNOSIS — Z005 Encounter for examination of potential donor of organ and tissue: Secondary | ICD-10-CM | POA: Diagnosis not present

## 2017-10-11 DIAGNOSIS — Z23 Encounter for immunization: Secondary | ICD-10-CM | POA: Diagnosis not present

## 2017-10-22 DIAGNOSIS — R5383 Other fatigue: Secondary | ICD-10-CM | POA: Diagnosis not present

## 2017-10-22 DIAGNOSIS — R5381 Other malaise: Secondary | ICD-10-CM | POA: Diagnosis not present

## 2017-10-22 DIAGNOSIS — N051 Unspecified nephritic syndrome with focal and segmental glomerular lesions: Secondary | ICD-10-CM | POA: Diagnosis not present

## 2017-10-29 DIAGNOSIS — N051 Unspecified nephritic syndrome with focal and segmental glomerular lesions: Secondary | ICD-10-CM | POA: Diagnosis not present

## 2017-11-17 DIAGNOSIS — Z006 Encounter for examination for normal comparison and control in clinical research program: Secondary | ICD-10-CM | POA: Diagnosis not present

## 2017-11-17 DIAGNOSIS — N051 Unspecified nephritic syndrome with focal and segmental glomerular lesions: Secondary | ICD-10-CM | POA: Diagnosis not present

## 2017-11-17 DIAGNOSIS — Z7682 Awaiting organ transplant status: Secondary | ICD-10-CM | POA: Diagnosis not present

## 2017-11-17 DIAGNOSIS — N189 Chronic kidney disease, unspecified: Secondary | ICD-10-CM | POA: Diagnosis not present

## 2017-12-01 DIAGNOSIS — N051 Unspecified nephritic syndrome with focal and segmental glomerular lesions: Secondary | ICD-10-CM | POA: Diagnosis not present

## 2017-12-07 DIAGNOSIS — Z6820 Body mass index (BMI) 20.0-20.9, adult: Secondary | ICD-10-CM | POA: Diagnosis not present

## 2017-12-07 DIAGNOSIS — R9431 Abnormal electrocardiogram [ECG] [EKG]: Secondary | ICD-10-CM | POA: Diagnosis not present

## 2017-12-07 DIAGNOSIS — Z880 Allergy status to penicillin: Secondary | ICD-10-CM | POA: Diagnosis not present

## 2017-12-07 DIAGNOSIS — Z9049 Acquired absence of other specified parts of digestive tract: Secondary | ICD-10-CM | POA: Diagnosis not present

## 2017-12-07 DIAGNOSIS — Z005 Encounter for examination of potential donor of organ and tissue: Secondary | ICD-10-CM | POA: Diagnosis not present

## 2017-12-07 DIAGNOSIS — Z888 Allergy status to other drugs, medicaments and biological substances status: Secondary | ICD-10-CM | POA: Diagnosis not present

## 2017-12-07 DIAGNOSIS — Z524 Kidney donor: Secondary | ICD-10-CM | POA: Diagnosis not present

## 2017-12-07 DIAGNOSIS — Z79899 Other long term (current) drug therapy: Secondary | ICD-10-CM | POA: Diagnosis not present

## 2017-12-07 DIAGNOSIS — I10 Essential (primary) hypertension: Secondary | ICD-10-CM | POA: Diagnosis not present

## 2017-12-08 DIAGNOSIS — Z79899 Other long term (current) drug therapy: Secondary | ICD-10-CM | POA: Diagnosis not present

## 2017-12-08 DIAGNOSIS — I10 Essential (primary) hypertension: Secondary | ICD-10-CM | POA: Diagnosis not present

## 2017-12-08 DIAGNOSIS — M47812 Spondylosis without myelopathy or radiculopathy, cervical region: Secondary | ICD-10-CM | POA: Diagnosis not present

## 2017-12-08 DIAGNOSIS — K589 Irritable bowel syndrome without diarrhea: Secondary | ICD-10-CM | POA: Diagnosis not present

## 2017-12-08 DIAGNOSIS — Z005 Encounter for examination of potential donor of organ and tissue: Secondary | ICD-10-CM | POA: Diagnosis not present

## 2017-12-08 DIAGNOSIS — M1612 Unilateral primary osteoarthritis, left hip: Secondary | ICD-10-CM | POA: Diagnosis not present

## 2017-12-09 DIAGNOSIS — I340 Nonrheumatic mitral (valve) insufficiency: Secondary | ICD-10-CM | POA: Diagnosis not present

## 2017-12-09 DIAGNOSIS — R931 Abnormal findings on diagnostic imaging of heart and coronary circulation: Secondary | ICD-10-CM | POA: Diagnosis not present

## 2017-12-09 DIAGNOSIS — Z005 Encounter for examination of potential donor of organ and tissue: Secondary | ICD-10-CM | POA: Diagnosis not present

## 2017-12-09 DIAGNOSIS — I774 Celiac artery compression syndrome: Secondary | ICD-10-CM | POA: Diagnosis not present

## 2017-12-09 DIAGNOSIS — K862 Cyst of pancreas: Secondary | ICD-10-CM | POA: Diagnosis not present

## 2018-01-04 DIAGNOSIS — N184 Chronic kidney disease, stage 4 (severe): Secondary | ICD-10-CM | POA: Diagnosis not present

## 2018-01-05 DIAGNOSIS — Z114 Encounter for screening for human immunodeficiency virus [HIV]: Secondary | ICD-10-CM | POA: Diagnosis not present

## 2018-01-05 DIAGNOSIS — N185 Chronic kidney disease, stage 5: Secondary | ICD-10-CM | POA: Diagnosis not present

## 2018-01-05 DIAGNOSIS — Z01818 Encounter for other preprocedural examination: Secondary | ICD-10-CM | POA: Diagnosis not present

## 2018-01-06 DIAGNOSIS — Z01818 Encounter for other preprocedural examination: Secondary | ICD-10-CM | POA: Diagnosis not present

## 2018-01-07 DIAGNOSIS — N184 Chronic kidney disease, stage 4 (severe): Secondary | ICD-10-CM | POA: Diagnosis not present

## 2018-01-07 DIAGNOSIS — N185 Chronic kidney disease, stage 5: Secondary | ICD-10-CM | POA: Diagnosis not present

## 2018-01-07 DIAGNOSIS — Z8619 Personal history of other infectious and parasitic diseases: Secondary | ICD-10-CM | POA: Diagnosis not present

## 2018-01-07 DIAGNOSIS — Z779 Other contact with and (suspected) exposures hazardous to health: Secondary | ICD-10-CM | POA: Diagnosis not present

## 2018-01-07 DIAGNOSIS — Z23 Encounter for immunization: Secondary | ICD-10-CM | POA: Diagnosis not present

## 2018-01-07 DIAGNOSIS — Z418 Encounter for other procedures for purposes other than remedying health state: Secondary | ICD-10-CM | POA: Diagnosis not present

## 2018-01-07 DIAGNOSIS — Z01818 Encounter for other preprocedural examination: Secondary | ICD-10-CM | POA: Diagnosis not present

## 2018-01-07 DIAGNOSIS — Z005 Encounter for examination of potential donor of organ and tissue: Secondary | ICD-10-CM | POA: Diagnosis not present

## 2018-01-08 DIAGNOSIS — N185 Chronic kidney disease, stage 5: Secondary | ICD-10-CM | POA: Diagnosis not present

## 2018-01-08 DIAGNOSIS — Z779 Other contact with and (suspected) exposures hazardous to health: Secondary | ICD-10-CM | POA: Diagnosis not present

## 2018-01-08 DIAGNOSIS — Z01818 Encounter for other preprocedural examination: Secondary | ICD-10-CM | POA: Diagnosis not present

## 2018-01-08 DIAGNOSIS — Z23 Encounter for immunization: Secondary | ICD-10-CM | POA: Diagnosis not present

## 2018-01-08 DIAGNOSIS — Z8619 Personal history of other infectious and parasitic diseases: Secondary | ICD-10-CM | POA: Diagnosis not present

## 2018-01-08 DIAGNOSIS — Z418 Encounter for other procedures for purposes other than remedying health state: Secondary | ICD-10-CM | POA: Diagnosis not present

## 2018-01-11 DIAGNOSIS — N189 Chronic kidney disease, unspecified: Secondary | ICD-10-CM | POA: Diagnosis not present

## 2018-01-11 DIAGNOSIS — Z01818 Encounter for other preprocedural examination: Secondary | ICD-10-CM | POA: Diagnosis not present

## 2018-01-13 DIAGNOSIS — M1612 Unilateral primary osteoarthritis, left hip: Secondary | ICD-10-CM | POA: Diagnosis not present

## 2018-01-13 DIAGNOSIS — Z79899 Other long term (current) drug therapy: Secondary | ICD-10-CM | POA: Diagnosis not present

## 2018-01-13 DIAGNOSIS — K862 Cyst of pancreas: Secondary | ICD-10-CM | POA: Diagnosis not present

## 2018-01-13 DIAGNOSIS — I774 Celiac artery compression syndrome: Secondary | ICD-10-CM | POA: Diagnosis not present

## 2018-01-13 DIAGNOSIS — I10 Essential (primary) hypertension: Secondary | ICD-10-CM | POA: Diagnosis not present

## 2018-01-13 DIAGNOSIS — Z880 Allergy status to penicillin: Secondary | ICD-10-CM | POA: Diagnosis not present

## 2018-02-09 DIAGNOSIS — Z7682 Awaiting organ transplant status: Secondary | ICD-10-CM | POA: Diagnosis not present

## 2018-02-09 DIAGNOSIS — Z006 Encounter for examination for normal comparison and control in clinical research program: Secondary | ICD-10-CM | POA: Diagnosis not present

## 2018-02-10 DIAGNOSIS — I10 Essential (primary) hypertension: Secondary | ICD-10-CM | POA: Diagnosis not present

## 2018-02-10 DIAGNOSIS — Z79899 Other long term (current) drug therapy: Secondary | ICD-10-CM | POA: Diagnosis not present

## 2018-02-10 DIAGNOSIS — D490 Neoplasm of unspecified behavior of digestive system: Secondary | ICD-10-CM | POA: Diagnosis not present

## 2018-02-10 DIAGNOSIS — Z6820 Body mass index (BMI) 20.0-20.9, adult: Secondary | ICD-10-CM | POA: Diagnosis not present

## 2018-02-16 DIAGNOSIS — Z006 Encounter for examination for normal comparison and control in clinical research program: Secondary | ICD-10-CM | POA: Diagnosis not present

## 2018-02-19 DIAGNOSIS — N031 Chronic nephritic syndrome with focal and segmental glomerular lesions: Secondary | ICD-10-CM | POA: Diagnosis not present

## 2018-02-19 DIAGNOSIS — Q796 Ehlers-Danlos syndrome, unspecified: Secondary | ICD-10-CM | POA: Diagnosis not present

## 2018-02-19 DIAGNOSIS — N184 Chronic kidney disease, stage 4 (severe): Secondary | ICD-10-CM | POA: Diagnosis not present

## 2018-02-19 DIAGNOSIS — K5939 Other megacolon: Secondary | ICD-10-CM | POA: Diagnosis not present

## 2018-02-25 DIAGNOSIS — R109 Unspecified abdominal pain: Secondary | ICD-10-CM | POA: Diagnosis not present

## 2018-02-25 DIAGNOSIS — N2889 Other specified disorders of kidney and ureter: Secondary | ICD-10-CM | POA: Diagnosis not present

## 2018-02-25 DIAGNOSIS — N051 Unspecified nephritic syndrome with focal and segmental glomerular lesions: Secondary | ICD-10-CM | POA: Diagnosis not present

## 2018-02-25 DIAGNOSIS — Z01818 Encounter for other preprocedural examination: Secondary | ICD-10-CM | POA: Diagnosis not present

## 2018-02-25 DIAGNOSIS — K824 Cholesterolosis of gallbladder: Secondary | ICD-10-CM | POA: Diagnosis not present

## 2018-02-25 DIAGNOSIS — N184 Chronic kidney disease, stage 4 (severe): Secondary | ICD-10-CM | POA: Diagnosis not present

## 2018-03-01 DIAGNOSIS — Z681 Body mass index (BMI) 19 or less, adult: Secondary | ICD-10-CM | POA: Diagnosis not present

## 2018-03-01 DIAGNOSIS — K219 Gastro-esophageal reflux disease without esophagitis: Secondary | ICD-10-CM | POA: Diagnosis not present

## 2018-03-01 DIAGNOSIS — N184 Chronic kidney disease, stage 4 (severe): Secondary | ICD-10-CM | POA: Diagnosis not present

## 2018-03-01 DIAGNOSIS — K824 Cholesterolosis of gallbladder: Secondary | ICD-10-CM | POA: Diagnosis not present

## 2018-03-01 DIAGNOSIS — I129 Hypertensive chronic kidney disease with stage 1 through stage 4 chronic kidney disease, or unspecified chronic kidney disease: Secondary | ICD-10-CM | POA: Diagnosis not present

## 2018-04-28 DIAGNOSIS — Z006 Encounter for examination for normal comparison and control in clinical research program: Secondary | ICD-10-CM | POA: Diagnosis not present

## 2018-06-07 DIAGNOSIS — K824 Cholesterolosis of gallbladder: Secondary | ICD-10-CM | POA: Diagnosis not present

## 2018-06-07 DIAGNOSIS — N184 Chronic kidney disease, stage 4 (severe): Secondary | ICD-10-CM | POA: Diagnosis not present

## 2018-06-16 DIAGNOSIS — Z1159 Encounter for screening for other viral diseases: Secondary | ICD-10-CM | POA: Diagnosis not present

## 2018-06-18 DIAGNOSIS — Z9049 Acquired absence of other specified parts of digestive tract: Secondary | ICD-10-CM | POA: Diagnosis not present

## 2018-06-18 DIAGNOSIS — N184 Chronic kidney disease, stage 4 (severe): Secondary | ICD-10-CM | POA: Diagnosis not present

## 2018-06-18 DIAGNOSIS — I129 Hypertensive chronic kidney disease with stage 1 through stage 4 chronic kidney disease, or unspecified chronic kidney disease: Secondary | ICD-10-CM | POA: Diagnosis not present

## 2018-06-18 DIAGNOSIS — K219 Gastro-esophageal reflux disease without esophagitis: Secondary | ICD-10-CM | POA: Diagnosis not present

## 2018-06-18 DIAGNOSIS — K824 Cholesterolosis of gallbladder: Secondary | ICD-10-CM | POA: Diagnosis not present

## 2018-06-19 DIAGNOSIS — N184 Chronic kidney disease, stage 4 (severe): Secondary | ICD-10-CM | POA: Diagnosis not present

## 2018-06-19 DIAGNOSIS — Z9049 Acquired absence of other specified parts of digestive tract: Secondary | ICD-10-CM | POA: Diagnosis not present

## 2018-06-19 DIAGNOSIS — K824 Cholesterolosis of gallbladder: Secondary | ICD-10-CM | POA: Diagnosis not present

## 2018-06-19 DIAGNOSIS — K219 Gastro-esophageal reflux disease without esophagitis: Secondary | ICD-10-CM | POA: Diagnosis not present

## 2018-06-19 DIAGNOSIS — I129 Hypertensive chronic kidney disease with stage 1 through stage 4 chronic kidney disease, or unspecified chronic kidney disease: Secondary | ICD-10-CM | POA: Diagnosis not present

## 2018-06-23 DIAGNOSIS — Z9049 Acquired absence of other specified parts of digestive tract: Secondary | ICD-10-CM | POA: Diagnosis not present

## 2018-06-23 DIAGNOSIS — N184 Chronic kidney disease, stage 4 (severe): Secondary | ICD-10-CM | POA: Diagnosis not present

## 2018-07-05 DIAGNOSIS — Z9049 Acquired absence of other specified parts of digestive tract: Secondary | ICD-10-CM | POA: Diagnosis not present

## 2018-07-05 DIAGNOSIS — Z09 Encounter for follow-up examination after completed treatment for conditions other than malignant neoplasm: Secondary | ICD-10-CM | POA: Diagnosis not present

## 2018-07-05 DIAGNOSIS — Z23 Encounter for immunization: Secondary | ICD-10-CM | POA: Diagnosis not present

## 2018-07-13 DIAGNOSIS — Z005 Encounter for examination of potential donor of organ and tissue: Secondary | ICD-10-CM | POA: Diagnosis not present

## 2018-07-13 DIAGNOSIS — K8020 Calculus of gallbladder without cholecystitis without obstruction: Secondary | ICD-10-CM | POA: Diagnosis not present

## 2018-07-22 DIAGNOSIS — Z006 Encounter for examination for normal comparison and control in clinical research program: Secondary | ICD-10-CM | POA: Diagnosis not present

## 2018-07-28 HISTORY — PX: KIDNEY TRANSPLANT: SHX239

## 2018-08-02 DIAGNOSIS — Z005 Encounter for examination of potential donor of organ and tissue: Secondary | ICD-10-CM | POA: Diagnosis not present

## 2018-08-02 DIAGNOSIS — N281 Cyst of kidney, acquired: Secondary | ICD-10-CM | POA: Diagnosis not present

## 2018-08-02 DIAGNOSIS — N289 Disorder of kidney and ureter, unspecified: Secondary | ICD-10-CM | POA: Diagnosis not present

## 2018-08-02 DIAGNOSIS — Z524 Kidney donor: Secondary | ICD-10-CM | POA: Diagnosis not present

## 2018-08-02 DIAGNOSIS — N1330 Unspecified hydronephrosis: Secondary | ICD-10-CM | POA: Diagnosis not present

## 2018-08-09 DIAGNOSIS — Z940 Kidney transplant status: Secondary | ICD-10-CM | POA: Insufficient documentation

## 2018-08-13 DIAGNOSIS — Z7682 Awaiting organ transplant status: Secondary | ICD-10-CM | POA: Diagnosis not present

## 2018-08-13 DIAGNOSIS — N051 Unspecified nephritic syndrome with focal and segmental glomerular lesions: Secondary | ICD-10-CM | POA: Diagnosis not present

## 2018-08-13 DIAGNOSIS — N184 Chronic kidney disease, stage 4 (severe): Secondary | ICD-10-CM | POA: Diagnosis not present

## 2018-08-17 DIAGNOSIS — Z01419 Encounter for gynecological examination (general) (routine) without abnormal findings: Secondary | ICD-10-CM | POA: Diagnosis not present

## 2018-08-17 DIAGNOSIS — Z681 Body mass index (BMI) 19 or less, adult: Secondary | ICD-10-CM | POA: Diagnosis not present

## 2018-08-19 DIAGNOSIS — N051 Unspecified nephritic syndrome with focal and segmental glomerular lesions: Secondary | ICD-10-CM | POA: Diagnosis not present

## 2018-08-19 DIAGNOSIS — Z01818 Encounter for other preprocedural examination: Secondary | ICD-10-CM | POA: Diagnosis not present

## 2018-08-19 DIAGNOSIS — N184 Chronic kidney disease, stage 4 (severe): Secondary | ICD-10-CM | POA: Diagnosis not present

## 2018-08-21 DIAGNOSIS — Z01818 Encounter for other preprocedural examination: Secondary | ICD-10-CM | POA: Diagnosis not present

## 2018-08-21 DIAGNOSIS — Z992 Dependence on renal dialysis: Secondary | ICD-10-CM | POA: Diagnosis not present

## 2018-08-21 DIAGNOSIS — N186 End stage renal disease: Secondary | ICD-10-CM | POA: Diagnosis not present

## 2018-08-21 DIAGNOSIS — Z940 Kidney transplant status: Secondary | ICD-10-CM | POA: Diagnosis not present

## 2018-08-21 DIAGNOSIS — Z20828 Contact with and (suspected) exposure to other viral communicable diseases: Secondary | ICD-10-CM | POA: Diagnosis not present

## 2018-08-21 DIAGNOSIS — Z4682 Encounter for fitting and adjustment of non-vascular catheter: Secondary | ICD-10-CM | POA: Diagnosis not present

## 2018-08-21 DIAGNOSIS — I499 Cardiac arrhythmia, unspecified: Secondary | ICD-10-CM | POA: Diagnosis not present

## 2018-08-21 DIAGNOSIS — K219 Gastro-esophageal reflux disease without esophagitis: Secondary | ICD-10-CM | POA: Diagnosis not present

## 2018-08-21 DIAGNOSIS — I120 Hypertensive chronic kidney disease with stage 5 chronic kidney disease or end stage renal disease: Secondary | ICD-10-CM | POA: Diagnosis not present

## 2018-08-21 DIAGNOSIS — Z01810 Encounter for preprocedural cardiovascular examination: Secondary | ICD-10-CM | POA: Diagnosis not present

## 2018-08-21 DIAGNOSIS — Z524 Kidney donor: Secondary | ICD-10-CM | POA: Diagnosis not present

## 2018-08-21 DIAGNOSIS — Q796 Ehlers-Danlos syndrome, unspecified: Secondary | ICD-10-CM | POA: Diagnosis not present

## 2018-08-21 DIAGNOSIS — Z1159 Encounter for screening for other viral diseases: Secondary | ICD-10-CM | POA: Diagnosis not present

## 2018-08-23 DIAGNOSIS — Z4682 Encounter for fitting and adjustment of non-vascular catheter: Secondary | ICD-10-CM | POA: Diagnosis not present

## 2018-08-27 DIAGNOSIS — Z940 Kidney transplant status: Secondary | ICD-10-CM | POA: Diagnosis not present

## 2018-08-27 DIAGNOSIS — D899 Disorder involving the immune mechanism, unspecified: Secondary | ICD-10-CM | POA: Diagnosis not present

## 2018-08-27 DIAGNOSIS — B9789 Other viral agents as the cause of diseases classified elsewhere: Secondary | ICD-10-CM | POA: Diagnosis not present

## 2018-08-27 DIAGNOSIS — Q796 Ehlers-Danlos syndrome, unspecified: Secondary | ICD-10-CM | POA: Diagnosis not present

## 2018-08-27 DIAGNOSIS — Z131 Encounter for screening for diabetes mellitus: Secondary | ICD-10-CM | POA: Diagnosis not present

## 2018-08-27 DIAGNOSIS — N051 Unspecified nephritic syndrome with focal and segmental glomerular lesions: Secondary | ICD-10-CM | POA: Diagnosis not present

## 2018-08-27 DIAGNOSIS — I129 Hypertensive chronic kidney disease with stage 1 through stage 4 chronic kidney disease, or unspecified chronic kidney disease: Secondary | ICD-10-CM | POA: Diagnosis not present

## 2018-08-31 DIAGNOSIS — B9789 Other viral agents as the cause of diseases classified elsewhere: Secondary | ICD-10-CM | POA: Diagnosis not present

## 2018-08-31 DIAGNOSIS — N051 Unspecified nephritic syndrome with focal and segmental glomerular lesions: Secondary | ICD-10-CM | POA: Diagnosis not present

## 2018-08-31 DIAGNOSIS — D899 Disorder involving the immune mechanism, unspecified: Secondary | ICD-10-CM | POA: Diagnosis not present

## 2018-08-31 DIAGNOSIS — Q796 Ehlers-Danlos syndrome, unspecified: Secondary | ICD-10-CM | POA: Diagnosis not present

## 2018-08-31 DIAGNOSIS — Z940 Kidney transplant status: Secondary | ICD-10-CM | POA: Diagnosis not present

## 2018-09-02 DIAGNOSIS — Z466 Encounter for fitting and adjustment of urinary device: Secondary | ICD-10-CM | POA: Diagnosis not present

## 2018-09-02 DIAGNOSIS — Z940 Kidney transplant status: Secondary | ICD-10-CM | POA: Diagnosis not present

## 2018-09-06 DIAGNOSIS — Z940 Kidney transplant status: Secondary | ICD-10-CM | POA: Diagnosis not present

## 2018-09-06 DIAGNOSIS — D899 Disorder involving the immune mechanism, unspecified: Secondary | ICD-10-CM | POA: Diagnosis not present

## 2018-09-06 DIAGNOSIS — N051 Unspecified nephritic syndrome with focal and segmental glomerular lesions: Secondary | ICD-10-CM | POA: Diagnosis not present

## 2018-09-06 DIAGNOSIS — Q796 Ehlers-Danlos syndrome, unspecified: Secondary | ICD-10-CM | POA: Diagnosis not present

## 2018-09-09 DIAGNOSIS — Z940 Kidney transplant status: Secondary | ICD-10-CM | POA: Diagnosis not present

## 2018-09-09 DIAGNOSIS — N051 Unspecified nephritic syndrome with focal and segmental glomerular lesions: Secondary | ICD-10-CM | POA: Diagnosis not present

## 2018-09-09 DIAGNOSIS — D899 Disorder involving the immune mechanism, unspecified: Secondary | ICD-10-CM | POA: Diagnosis not present

## 2018-09-09 DIAGNOSIS — R079 Chest pain, unspecified: Secondary | ICD-10-CM | POA: Diagnosis not present

## 2018-09-09 DIAGNOSIS — R791 Abnormal coagulation profile: Secondary | ICD-10-CM | POA: Diagnosis not present

## 2018-09-09 DIAGNOSIS — Z792 Long term (current) use of antibiotics: Secondary | ICD-10-CM | POA: Diagnosis not present

## 2018-09-09 DIAGNOSIS — R51 Headache: Secondary | ICD-10-CM | POA: Diagnosis not present

## 2018-09-13 DIAGNOSIS — Z940 Kidney transplant status: Secondary | ICD-10-CM | POA: Diagnosis not present

## 2018-09-13 DIAGNOSIS — E611 Iron deficiency: Secondary | ICD-10-CM | POA: Diagnosis not present

## 2018-09-13 DIAGNOSIS — N051 Unspecified nephritic syndrome with focal and segmental glomerular lesions: Secondary | ICD-10-CM | POA: Diagnosis not present

## 2018-09-13 DIAGNOSIS — D899 Disorder involving the immune mechanism, unspecified: Secondary | ICD-10-CM | POA: Diagnosis not present

## 2018-09-14 DIAGNOSIS — Z466 Encounter for fitting and adjustment of urinary device: Secondary | ICD-10-CM | POA: Diagnosis not present

## 2018-09-16 DIAGNOSIS — D899 Disorder involving the immune mechanism, unspecified: Secondary | ICD-10-CM | POA: Diagnosis not present

## 2018-09-21 DIAGNOSIS — D631 Anemia in chronic kidney disease: Secondary | ICD-10-CM | POA: Diagnosis not present

## 2018-09-21 DIAGNOSIS — Z79899 Other long term (current) drug therapy: Secondary | ICD-10-CM | POA: Diagnosis not present

## 2018-09-21 DIAGNOSIS — Z940 Kidney transplant status: Secondary | ICD-10-CM | POA: Diagnosis not present

## 2018-09-21 DIAGNOSIS — D899 Disorder involving the immune mechanism, unspecified: Secondary | ICD-10-CM | POA: Diagnosis not present

## 2018-09-21 DIAGNOSIS — N189 Chronic kidney disease, unspecified: Secondary | ICD-10-CM | POA: Diagnosis not present

## 2018-09-21 DIAGNOSIS — N051 Unspecified nephritic syndrome with focal and segmental glomerular lesions: Secondary | ICD-10-CM | POA: Diagnosis not present

## 2018-09-21 DIAGNOSIS — Z792 Long term (current) use of antibiotics: Secondary | ICD-10-CM | POA: Diagnosis not present

## 2018-09-23 DIAGNOSIS — Z940 Kidney transplant status: Secondary | ICD-10-CM | POA: Diagnosis not present

## 2018-09-23 DIAGNOSIS — N051 Unspecified nephritic syndrome with focal and segmental glomerular lesions: Secondary | ICD-10-CM | POA: Diagnosis not present

## 2018-09-23 DIAGNOSIS — Z79899 Other long term (current) drug therapy: Secondary | ICD-10-CM | POA: Diagnosis not present

## 2018-09-23 DIAGNOSIS — N050 Unspecified nephritic syndrome with minor glomerular abnormality: Secondary | ICD-10-CM | POA: Diagnosis not present

## 2018-09-23 DIAGNOSIS — B9789 Other viral agents as the cause of diseases classified elsewhere: Secondary | ICD-10-CM | POA: Diagnosis not present

## 2018-09-23 DIAGNOSIS — N250 Renal osteodystrophy: Secondary | ICD-10-CM | POA: Diagnosis not present

## 2018-09-27 DIAGNOSIS — Z79899 Other long term (current) drug therapy: Secondary | ICD-10-CM | POA: Diagnosis not present

## 2018-09-27 DIAGNOSIS — R799 Abnormal finding of blood chemistry, unspecified: Secondary | ICD-10-CM | POA: Diagnosis not present

## 2018-09-27 DIAGNOSIS — Z940 Kidney transplant status: Secondary | ICD-10-CM | POA: Diagnosis not present

## 2018-10-05 DIAGNOSIS — D899 Disorder involving the immune mechanism, unspecified: Secondary | ICD-10-CM | POA: Diagnosis not present

## 2018-10-05 DIAGNOSIS — R000 Tachycardia, unspecified: Secondary | ICD-10-CM | POA: Diagnosis not present

## 2018-10-05 DIAGNOSIS — N051 Unspecified nephritic syndrome with focal and segmental glomerular lesions: Secondary | ICD-10-CM | POA: Diagnosis not present

## 2018-10-05 DIAGNOSIS — Z792 Long term (current) use of antibiotics: Secondary | ICD-10-CM | POA: Diagnosis not present

## 2018-10-05 DIAGNOSIS — Z79899 Other long term (current) drug therapy: Secondary | ICD-10-CM | POA: Diagnosis not present

## 2018-10-05 DIAGNOSIS — Z940 Kidney transplant status: Secondary | ICD-10-CM | POA: Diagnosis not present

## 2018-10-05 DIAGNOSIS — B9789 Other viral agents as the cause of diseases classified elsewhere: Secondary | ICD-10-CM | POA: Diagnosis not present

## 2018-10-07 DIAGNOSIS — Z940 Kidney transplant status: Secondary | ICD-10-CM | POA: Diagnosis not present

## 2018-10-11 DIAGNOSIS — R799 Abnormal finding of blood chemistry, unspecified: Secondary | ICD-10-CM | POA: Diagnosis not present

## 2018-10-11 DIAGNOSIS — Z79899 Other long term (current) drug therapy: Secondary | ICD-10-CM | POA: Diagnosis not present

## 2018-10-11 DIAGNOSIS — Z940 Kidney transplant status: Secondary | ICD-10-CM | POA: Diagnosis not present

## 2018-10-18 DIAGNOSIS — Z79899 Other long term (current) drug therapy: Secondary | ICD-10-CM | POA: Diagnosis not present

## 2018-10-18 DIAGNOSIS — Z940 Kidney transplant status: Secondary | ICD-10-CM | POA: Diagnosis not present

## 2018-10-25 DIAGNOSIS — R799 Abnormal finding of blood chemistry, unspecified: Secondary | ICD-10-CM | POA: Diagnosis not present

## 2018-10-25 DIAGNOSIS — Z79899 Other long term (current) drug therapy: Secondary | ICD-10-CM | POA: Diagnosis not present

## 2018-10-25 DIAGNOSIS — Z940 Kidney transplant status: Secondary | ICD-10-CM | POA: Diagnosis not present

## 2018-11-01 DIAGNOSIS — Z79899 Other long term (current) drug therapy: Secondary | ICD-10-CM | POA: Diagnosis not present

## 2018-11-01 DIAGNOSIS — Z940 Kidney transplant status: Secondary | ICD-10-CM | POA: Diagnosis not present

## 2018-11-01 DIAGNOSIS — R799 Abnormal finding of blood chemistry, unspecified: Secondary | ICD-10-CM | POA: Diagnosis not present

## 2018-11-08 DIAGNOSIS — Z940 Kidney transplant status: Secondary | ICD-10-CM | POA: Diagnosis not present

## 2018-11-08 DIAGNOSIS — R799 Abnormal finding of blood chemistry, unspecified: Secondary | ICD-10-CM | POA: Diagnosis not present

## 2018-11-08 DIAGNOSIS — Z792 Long term (current) use of antibiotics: Secondary | ICD-10-CM | POA: Diagnosis not present

## 2018-11-08 DIAGNOSIS — Z79899 Other long term (current) drug therapy: Secondary | ICD-10-CM | POA: Diagnosis not present

## 2018-11-15 DIAGNOSIS — Z940 Kidney transplant status: Secondary | ICD-10-CM | POA: Diagnosis not present

## 2018-11-15 DIAGNOSIS — R799 Abnormal finding of blood chemistry, unspecified: Secondary | ICD-10-CM | POA: Diagnosis not present

## 2018-11-15 DIAGNOSIS — Z79899 Other long term (current) drug therapy: Secondary | ICD-10-CM | POA: Diagnosis not present

## 2018-11-16 DIAGNOSIS — Z940 Kidney transplant status: Secondary | ICD-10-CM | POA: Diagnosis not present

## 2018-11-22 DIAGNOSIS — Z940 Kidney transplant status: Secondary | ICD-10-CM | POA: Diagnosis not present

## 2018-11-22 DIAGNOSIS — R799 Abnormal finding of blood chemistry, unspecified: Secondary | ICD-10-CM | POA: Diagnosis not present

## 2018-11-22 DIAGNOSIS — Z79899 Other long term (current) drug therapy: Secondary | ICD-10-CM | POA: Diagnosis not present

## 2018-11-29 DIAGNOSIS — Z940 Kidney transplant status: Secondary | ICD-10-CM | POA: Diagnosis not present

## 2018-11-29 DIAGNOSIS — Z79899 Other long term (current) drug therapy: Secondary | ICD-10-CM | POA: Diagnosis not present

## 2018-11-29 DIAGNOSIS — R799 Abnormal finding of blood chemistry, unspecified: Secondary | ICD-10-CM | POA: Diagnosis not present

## 2018-12-06 DIAGNOSIS — Z79899 Other long term (current) drug therapy: Secondary | ICD-10-CM | POA: Diagnosis not present

## 2018-12-06 DIAGNOSIS — N051 Unspecified nephritic syndrome with focal and segmental glomerular lesions: Secondary | ICD-10-CM | POA: Diagnosis not present

## 2018-12-06 DIAGNOSIS — D631 Anemia in chronic kidney disease: Secondary | ICD-10-CM | POA: Diagnosis not present

## 2018-12-06 DIAGNOSIS — D849 Immunodeficiency, unspecified: Secondary | ICD-10-CM | POA: Diagnosis not present

## 2018-12-06 DIAGNOSIS — Z940 Kidney transplant status: Secondary | ICD-10-CM | POA: Diagnosis not present

## 2018-12-06 DIAGNOSIS — Q796 Ehlers-Danlos syndrome, unspecified: Secondary | ICD-10-CM | POA: Diagnosis not present

## 2018-12-06 DIAGNOSIS — N189 Chronic kidney disease, unspecified: Secondary | ICD-10-CM | POA: Diagnosis not present

## 2018-12-06 DIAGNOSIS — N250 Renal osteodystrophy: Secondary | ICD-10-CM | POA: Diagnosis not present

## 2018-12-06 DIAGNOSIS — B9789 Other viral agents as the cause of diseases classified elsewhere: Secondary | ICD-10-CM | POA: Diagnosis not present

## 2018-12-13 DIAGNOSIS — Z940 Kidney transplant status: Secondary | ICD-10-CM | POA: Diagnosis not present

## 2018-12-13 DIAGNOSIS — Z79899 Other long term (current) drug therapy: Secondary | ICD-10-CM | POA: Diagnosis not present

## 2018-12-13 DIAGNOSIS — R799 Abnormal finding of blood chemistry, unspecified: Secondary | ICD-10-CM | POA: Diagnosis not present

## 2018-12-20 DIAGNOSIS — Z79899 Other long term (current) drug therapy: Secondary | ICD-10-CM | POA: Diagnosis not present

## 2018-12-20 DIAGNOSIS — Z940 Kidney transplant status: Secondary | ICD-10-CM | POA: Diagnosis not present

## 2018-12-20 DIAGNOSIS — R799 Abnormal finding of blood chemistry, unspecified: Secondary | ICD-10-CM | POA: Diagnosis not present

## 2018-12-28 DIAGNOSIS — Z940 Kidney transplant status: Secondary | ICD-10-CM | POA: Diagnosis not present

## 2018-12-28 DIAGNOSIS — Z79899 Other long term (current) drug therapy: Secondary | ICD-10-CM | POA: Diagnosis not present

## 2018-12-28 DIAGNOSIS — R799 Abnormal finding of blood chemistry, unspecified: Secondary | ICD-10-CM | POA: Diagnosis not present

## 2019-01-04 DIAGNOSIS — Z79899 Other long term (current) drug therapy: Secondary | ICD-10-CM | POA: Diagnosis not present

## 2019-01-04 DIAGNOSIS — Z940 Kidney transplant status: Secondary | ICD-10-CM | POA: Diagnosis not present

## 2019-01-04 DIAGNOSIS — R799 Abnormal finding of blood chemistry, unspecified: Secondary | ICD-10-CM | POA: Diagnosis not present

## 2019-01-11 DIAGNOSIS — Z792 Long term (current) use of antibiotics: Secondary | ICD-10-CM | POA: Diagnosis not present

## 2019-01-11 DIAGNOSIS — D631 Anemia in chronic kidney disease: Secondary | ICD-10-CM | POA: Diagnosis not present

## 2019-01-11 DIAGNOSIS — N189 Chronic kidney disease, unspecified: Secondary | ICD-10-CM | POA: Diagnosis not present

## 2019-01-11 DIAGNOSIS — N250 Renal osteodystrophy: Secondary | ICD-10-CM | POA: Diagnosis not present

## 2019-01-11 DIAGNOSIS — N051 Unspecified nephritic syndrome with focal and segmental glomerular lesions: Secondary | ICD-10-CM | POA: Diagnosis not present

## 2019-01-11 DIAGNOSIS — Z01812 Encounter for preprocedural laboratory examination: Secondary | ICD-10-CM | POA: Diagnosis not present

## 2019-01-11 DIAGNOSIS — B9789 Other viral agents as the cause of diseases classified elsewhere: Secondary | ICD-10-CM | POA: Diagnosis not present

## 2019-01-11 DIAGNOSIS — Z940 Kidney transplant status: Secondary | ICD-10-CM | POA: Diagnosis not present

## 2019-01-11 DIAGNOSIS — Z79899 Other long term (current) drug therapy: Secondary | ICD-10-CM | POA: Diagnosis not present

## 2019-01-11 DIAGNOSIS — Z2089 Contact with and (suspected) exposure to other communicable diseases: Secondary | ICD-10-CM | POA: Diagnosis not present

## 2019-01-11 DIAGNOSIS — D849 Immunodeficiency, unspecified: Secondary | ICD-10-CM | POA: Diagnosis not present

## 2019-01-14 DIAGNOSIS — Z01812 Encounter for preprocedural laboratory examination: Secondary | ICD-10-CM | POA: Diagnosis not present

## 2019-01-14 DIAGNOSIS — Z20828 Contact with and (suspected) exposure to other viral communicable diseases: Secondary | ICD-10-CM | POA: Diagnosis not present

## 2019-01-18 DIAGNOSIS — Z940 Kidney transplant status: Secondary | ICD-10-CM | POA: Diagnosis not present

## 2019-01-18 DIAGNOSIS — R7989 Other specified abnormal findings of blood chemistry: Secondary | ICD-10-CM | POA: Diagnosis not present

## 2019-01-18 DIAGNOSIS — T8611 Kidney transplant rejection: Secondary | ICD-10-CM | POA: Diagnosis not present

## 2019-01-18 DIAGNOSIS — R799 Abnormal finding of blood chemistry, unspecified: Secondary | ICD-10-CM | POA: Diagnosis not present

## 2019-01-19 DIAGNOSIS — Z940 Kidney transplant status: Secondary | ICD-10-CM | POA: Diagnosis not present

## 2019-01-19 DIAGNOSIS — D631 Anemia in chronic kidney disease: Secondary | ICD-10-CM | POA: Diagnosis not present

## 2019-01-19 DIAGNOSIS — N189 Chronic kidney disease, unspecified: Secondary | ICD-10-CM | POA: Diagnosis not present

## 2019-01-19 DIAGNOSIS — Z79899 Other long term (current) drug therapy: Secondary | ICD-10-CM | POA: Diagnosis not present

## 2019-01-19 DIAGNOSIS — N250 Renal osteodystrophy: Secondary | ICD-10-CM | POA: Diagnosis not present

## 2019-01-25 DIAGNOSIS — Z940 Kidney transplant status: Secondary | ICD-10-CM | POA: Diagnosis not present

## 2019-01-25 DIAGNOSIS — R799 Abnormal finding of blood chemistry, unspecified: Secondary | ICD-10-CM | POA: Diagnosis not present

## 2019-01-25 DIAGNOSIS — Z79899 Other long term (current) drug therapy: Secondary | ICD-10-CM | POA: Diagnosis not present

## 2019-01-31 DIAGNOSIS — R799 Abnormal finding of blood chemistry, unspecified: Secondary | ICD-10-CM | POA: Diagnosis not present

## 2019-01-31 DIAGNOSIS — Z79899 Other long term (current) drug therapy: Secondary | ICD-10-CM | POA: Diagnosis not present

## 2019-01-31 DIAGNOSIS — Z940 Kidney transplant status: Secondary | ICD-10-CM | POA: Diagnosis not present

## 2019-02-08 DIAGNOSIS — R799 Abnormal finding of blood chemistry, unspecified: Secondary | ICD-10-CM | POA: Diagnosis not present

## 2019-02-08 DIAGNOSIS — Z79899 Other long term (current) drug therapy: Secondary | ICD-10-CM | POA: Diagnosis not present

## 2019-02-08 DIAGNOSIS — Z940 Kidney transplant status: Secondary | ICD-10-CM | POA: Diagnosis not present

## 2019-02-11 DIAGNOSIS — N766 Ulceration of vulva: Secondary | ICD-10-CM | POA: Diagnosis not present

## 2019-02-11 DIAGNOSIS — N9089 Other specified noninflammatory disorders of vulva and perineum: Secondary | ICD-10-CM | POA: Diagnosis not present

## 2019-02-16 DIAGNOSIS — K5900 Constipation, unspecified: Secondary | ICD-10-CM | POA: Diagnosis not present

## 2019-02-16 DIAGNOSIS — B9789 Other viral agents as the cause of diseases classified elsewhere: Secondary | ICD-10-CM | POA: Diagnosis not present

## 2019-02-16 DIAGNOSIS — R112 Nausea with vomiting, unspecified: Secondary | ICD-10-CM | POA: Diagnosis not present

## 2019-02-16 DIAGNOSIS — N250 Renal osteodystrophy: Secondary | ICD-10-CM | POA: Diagnosis not present

## 2019-02-16 DIAGNOSIS — Z940 Kidney transplant status: Secondary | ICD-10-CM | POA: Diagnosis not present

## 2019-02-16 DIAGNOSIS — Q796 Ehlers-Danlos syndrome, unspecified: Secondary | ICD-10-CM | POA: Diagnosis not present

## 2019-02-16 DIAGNOSIS — Z79899 Other long term (current) drug therapy: Secondary | ICD-10-CM | POA: Diagnosis not present

## 2019-02-16 DIAGNOSIS — Z792 Long term (current) use of antibiotics: Secondary | ICD-10-CM | POA: Diagnosis not present

## 2019-02-16 DIAGNOSIS — D849 Immunodeficiency, unspecified: Secondary | ICD-10-CM | POA: Diagnosis not present

## 2019-02-17 DIAGNOSIS — Z20828 Contact with and (suspected) exposure to other viral communicable diseases: Secondary | ICD-10-CM | POA: Diagnosis not present

## 2019-02-22 DIAGNOSIS — Z681 Body mass index (BMI) 19 or less, adult: Secondary | ICD-10-CM | POA: Diagnosis not present

## 2019-02-22 DIAGNOSIS — N184 Chronic kidney disease, stage 4 (severe): Secondary | ICD-10-CM | POA: Diagnosis not present

## 2019-02-22 DIAGNOSIS — Z940 Kidney transplant status: Secondary | ICD-10-CM | POA: Diagnosis not present

## 2019-02-23 DIAGNOSIS — R799 Abnormal finding of blood chemistry, unspecified: Secondary | ICD-10-CM | POA: Diagnosis not present

## 2019-02-23 DIAGNOSIS — Z79899 Other long term (current) drug therapy: Secondary | ICD-10-CM | POA: Diagnosis not present

## 2019-02-23 DIAGNOSIS — Z940 Kidney transplant status: Secondary | ICD-10-CM | POA: Diagnosis not present

## 2019-02-24 DIAGNOSIS — G43009 Migraine without aura, not intractable, without status migrainosus: Secondary | ICD-10-CM | POA: Diagnosis not present

## 2019-02-24 DIAGNOSIS — K5939 Other megacolon: Secondary | ICD-10-CM | POA: Diagnosis not present

## 2019-02-24 DIAGNOSIS — N031 Chronic nephritic syndrome with focal and segmental glomerular lesions: Secondary | ICD-10-CM | POA: Diagnosis not present

## 2019-02-24 DIAGNOSIS — Z940 Kidney transplant status: Secondary | ICD-10-CM | POA: Diagnosis not present

## 2019-02-28 DIAGNOSIS — N766 Ulceration of vulva: Secondary | ICD-10-CM | POA: Diagnosis not present

## 2019-02-28 DIAGNOSIS — K121 Other forms of stomatitis: Secondary | ICD-10-CM | POA: Diagnosis not present

## 2019-03-01 DIAGNOSIS — Z940 Kidney transplant status: Secondary | ICD-10-CM | POA: Diagnosis not present

## 2019-03-01 DIAGNOSIS — Z79899 Other long term (current) drug therapy: Secondary | ICD-10-CM | POA: Diagnosis not present

## 2019-03-01 DIAGNOSIS — R799 Abnormal finding of blood chemistry, unspecified: Secondary | ICD-10-CM | POA: Diagnosis not present

## 2019-03-02 DIAGNOSIS — K648 Other hemorrhoids: Secondary | ICD-10-CM | POA: Diagnosis not present

## 2019-03-03 DIAGNOSIS — K121 Other forms of stomatitis: Secondary | ICD-10-CM | POA: Diagnosis not present

## 2019-03-03 DIAGNOSIS — Z940 Kidney transplant status: Secondary | ICD-10-CM | POA: Diagnosis not present

## 2019-03-03 DIAGNOSIS — N766 Ulceration of vulva: Secondary | ICD-10-CM | POA: Diagnosis not present

## 2019-03-03 DIAGNOSIS — R634 Abnormal weight loss: Secondary | ICD-10-CM | POA: Diagnosis not present

## 2019-03-04 DIAGNOSIS — T451X5A Adverse effect of antineoplastic and immunosuppressive drugs, initial encounter: Secondary | ICD-10-CM | POA: Diagnosis not present

## 2019-03-04 DIAGNOSIS — K120 Recurrent oral aphthae: Secondary | ICD-10-CM | POA: Diagnosis not present

## 2019-03-04 DIAGNOSIS — Z4822 Encounter for aftercare following kidney transplant: Secondary | ICD-10-CM | POA: Diagnosis not present

## 2019-03-04 DIAGNOSIS — N765 Ulceration of vagina: Secondary | ICD-10-CM | POA: Diagnosis not present

## 2019-03-04 DIAGNOSIS — Z940 Kidney transplant status: Secondary | ICD-10-CM | POA: Diagnosis not present

## 2019-03-04 DIAGNOSIS — Z792 Long term (current) use of antibiotics: Secondary | ICD-10-CM | POA: Diagnosis not present

## 2019-03-04 DIAGNOSIS — Q796 Ehlers-Danlos syndrome, unspecified: Secondary | ICD-10-CM | POA: Diagnosis not present

## 2019-03-04 DIAGNOSIS — Z7952 Long term (current) use of systemic steroids: Secondary | ICD-10-CM | POA: Diagnosis not present

## 2019-03-04 DIAGNOSIS — B1081 Human herpesvirus 6 infection: Secondary | ICD-10-CM | POA: Diagnosis not present

## 2019-03-04 DIAGNOSIS — B9789 Other viral agents as the cause of diseases classified elsewhere: Secondary | ICD-10-CM | POA: Diagnosis not present

## 2019-03-04 DIAGNOSIS — K644 Residual hemorrhoidal skin tags: Secondary | ICD-10-CM | POA: Diagnosis not present

## 2019-03-04 DIAGNOSIS — G600 Hereditary motor and sensory neuropathy: Secondary | ICD-10-CM | POA: Diagnosis not present

## 2019-03-04 DIAGNOSIS — N766 Ulceration of vulva: Secondary | ICD-10-CM | POA: Diagnosis not present

## 2019-03-04 DIAGNOSIS — D849 Immunodeficiency, unspecified: Secondary | ICD-10-CM | POA: Diagnosis not present

## 2019-03-04 DIAGNOSIS — K219 Gastro-esophageal reflux disease without esophagitis: Secondary | ICD-10-CM | POA: Diagnosis not present

## 2019-03-04 DIAGNOSIS — R6889 Other general symptoms and signs: Secondary | ICD-10-CM | POA: Diagnosis not present

## 2019-03-04 DIAGNOSIS — K121 Other forms of stomatitis: Secondary | ICD-10-CM | POA: Diagnosis not present

## 2019-03-04 DIAGNOSIS — Z79899 Other long term (current) drug therapy: Secondary | ICD-10-CM | POA: Diagnosis not present

## 2019-03-04 DIAGNOSIS — F419 Anxiety disorder, unspecified: Secondary | ICD-10-CM | POA: Diagnosis not present

## 2019-03-04 DIAGNOSIS — Q7962 Hypermobile Ehlers-Danlos syndrome: Secondary | ICD-10-CM | POA: Diagnosis not present

## 2019-03-04 DIAGNOSIS — N2889 Other specified disorders of kidney and ureter: Secondary | ICD-10-CM | POA: Diagnosis not present

## 2019-03-04 DIAGNOSIS — R7989 Other specified abnormal findings of blood chemistry: Secondary | ICD-10-CM | POA: Diagnosis not present

## 2019-03-04 DIAGNOSIS — D84821 Immunodeficiency due to drugs: Secondary | ICD-10-CM | POA: Diagnosis not present

## 2019-03-04 DIAGNOSIS — I10 Essential (primary) hypertension: Secondary | ICD-10-CM | POA: Diagnosis not present

## 2019-03-04 DIAGNOSIS — Z8249 Family history of ischemic heart disease and other diseases of the circulatory system: Secondary | ICD-10-CM | POA: Diagnosis not present

## 2019-03-05 DIAGNOSIS — Z940 Kidney transplant status: Secondary | ICD-10-CM | POA: Diagnosis not present

## 2019-03-05 DIAGNOSIS — K120 Recurrent oral aphthae: Secondary | ICD-10-CM | POA: Diagnosis not present

## 2019-03-05 DIAGNOSIS — N766 Ulceration of vulva: Secondary | ICD-10-CM | POA: Diagnosis not present

## 2019-03-05 DIAGNOSIS — D849 Immunodeficiency, unspecified: Secondary | ICD-10-CM | POA: Diagnosis not present

## 2019-03-05 DIAGNOSIS — D84821 Immunodeficiency due to drugs: Secondary | ICD-10-CM | POA: Diagnosis not present

## 2019-03-05 DIAGNOSIS — N765 Ulceration of vagina: Secondary | ICD-10-CM | POA: Diagnosis not present

## 2019-03-06 DIAGNOSIS — B1081 Human herpesvirus 6 infection: Secondary | ICD-10-CM | POA: Diagnosis not present

## 2019-03-06 DIAGNOSIS — N766 Ulceration of vulva: Secondary | ICD-10-CM | POA: Diagnosis not present

## 2019-03-06 DIAGNOSIS — K120 Recurrent oral aphthae: Secondary | ICD-10-CM | POA: Diagnosis not present

## 2019-03-06 DIAGNOSIS — D849 Immunodeficiency, unspecified: Secondary | ICD-10-CM | POA: Diagnosis not present

## 2019-03-06 DIAGNOSIS — R6889 Other general symptoms and signs: Secondary | ICD-10-CM | POA: Diagnosis not present

## 2019-03-06 DIAGNOSIS — Z940 Kidney transplant status: Secondary | ICD-10-CM | POA: Diagnosis not present

## 2019-03-07 DIAGNOSIS — D849 Immunodeficiency, unspecified: Secondary | ICD-10-CM | POA: Diagnosis not present

## 2019-03-07 DIAGNOSIS — Z4822 Encounter for aftercare following kidney transplant: Secondary | ICD-10-CM | POA: Diagnosis not present

## 2019-03-07 DIAGNOSIS — K120 Recurrent oral aphthae: Secondary | ICD-10-CM | POA: Diagnosis not present

## 2019-03-07 DIAGNOSIS — N766 Ulceration of vulva: Secondary | ICD-10-CM | POA: Diagnosis not present

## 2019-03-07 DIAGNOSIS — N2889 Other specified disorders of kidney and ureter: Secondary | ICD-10-CM | POA: Diagnosis not present

## 2019-03-07 DIAGNOSIS — Z940 Kidney transplant status: Secondary | ICD-10-CM | POA: Diagnosis not present

## 2019-03-07 DIAGNOSIS — R7989 Other specified abnormal findings of blood chemistry: Secondary | ICD-10-CM | POA: Diagnosis not present

## 2019-03-08 DIAGNOSIS — D849 Immunodeficiency, unspecified: Secondary | ICD-10-CM | POA: Diagnosis not present

## 2019-03-08 DIAGNOSIS — K120 Recurrent oral aphthae: Secondary | ICD-10-CM | POA: Diagnosis not present

## 2019-03-08 DIAGNOSIS — R6889 Other general symptoms and signs: Secondary | ICD-10-CM | POA: Diagnosis not present

## 2019-03-08 DIAGNOSIS — N766 Ulceration of vulva: Secondary | ICD-10-CM | POA: Diagnosis not present

## 2019-03-08 DIAGNOSIS — Z940 Kidney transplant status: Secondary | ICD-10-CM | POA: Diagnosis not present

## 2019-03-14 DIAGNOSIS — N765 Ulceration of vagina: Secondary | ICD-10-CM | POA: Diagnosis not present

## 2019-03-14 DIAGNOSIS — Z79899 Other long term (current) drug therapy: Secondary | ICD-10-CM | POA: Diagnosis not present

## 2019-03-14 DIAGNOSIS — Z299 Encounter for prophylactic measures, unspecified: Secondary | ICD-10-CM | POA: Diagnosis not present

## 2019-03-14 DIAGNOSIS — Z940 Kidney transplant status: Secondary | ICD-10-CM | POA: Diagnosis not present

## 2019-03-14 DIAGNOSIS — D849 Immunodeficiency, unspecified: Secondary | ICD-10-CM | POA: Diagnosis not present

## 2019-03-14 DIAGNOSIS — Z792 Long term (current) use of antibiotics: Secondary | ICD-10-CM | POA: Diagnosis not present

## 2019-03-21 DIAGNOSIS — N051 Unspecified nephritic syndrome with focal and segmental glomerular lesions: Secondary | ICD-10-CM | POA: Diagnosis not present

## 2019-03-21 DIAGNOSIS — N184 Chronic kidney disease, stage 4 (severe): Secondary | ICD-10-CM | POA: Diagnosis not present

## 2019-03-28 DIAGNOSIS — N250 Renal osteodystrophy: Secondary | ICD-10-CM | POA: Diagnosis not present

## 2019-03-28 DIAGNOSIS — B9789 Other viral agents as the cause of diseases classified elsewhere: Secondary | ICD-10-CM | POA: Diagnosis not present

## 2019-03-28 DIAGNOSIS — I10 Essential (primary) hypertension: Secondary | ICD-10-CM | POA: Diagnosis not present

## 2019-03-28 DIAGNOSIS — Z79899 Other long term (current) drug therapy: Secondary | ICD-10-CM | POA: Diagnosis not present

## 2019-03-28 DIAGNOSIS — Q796 Ehlers-Danlos syndrome, unspecified: Secondary | ICD-10-CM | POA: Diagnosis not present

## 2019-03-28 DIAGNOSIS — Z0000 Encounter for general adult medical examination without abnormal findings: Secondary | ICD-10-CM | POA: Diagnosis not present

## 2019-03-28 DIAGNOSIS — Z940 Kidney transplant status: Secondary | ICD-10-CM | POA: Diagnosis not present

## 2019-03-28 DIAGNOSIS — D849 Immunodeficiency, unspecified: Secondary | ICD-10-CM | POA: Diagnosis not present

## 2019-03-28 DIAGNOSIS — Z792 Long term (current) use of antibiotics: Secondary | ICD-10-CM | POA: Diagnosis not present

## 2019-04-04 DIAGNOSIS — Z79899 Other long term (current) drug therapy: Secondary | ICD-10-CM | POA: Diagnosis not present

## 2019-04-04 DIAGNOSIS — T8610 Unspecified complication of kidney transplant: Secondary | ICD-10-CM | POA: Diagnosis not present

## 2019-04-04 DIAGNOSIS — Z940 Kidney transplant status: Secondary | ICD-10-CM | POA: Diagnosis not present

## 2019-04-04 DIAGNOSIS — R799 Abnormal finding of blood chemistry, unspecified: Secondary | ICD-10-CM | POA: Diagnosis not present

## 2019-04-07 DIAGNOSIS — N762 Acute vulvitis: Secondary | ICD-10-CM | POA: Diagnosis not present

## 2019-04-07 DIAGNOSIS — N766 Ulceration of vulva: Secondary | ICD-10-CM | POA: Diagnosis not present

## 2019-04-11 DIAGNOSIS — N766 Ulceration of vulva: Secondary | ICD-10-CM | POA: Diagnosis not present

## 2019-04-11 DIAGNOSIS — K121 Other forms of stomatitis: Secondary | ICD-10-CM | POA: Diagnosis not present

## 2019-04-11 DIAGNOSIS — Z940 Kidney transplant status: Secondary | ICD-10-CM | POA: Diagnosis not present

## 2019-04-12 DIAGNOSIS — R799 Abnormal finding of blood chemistry, unspecified: Secondary | ICD-10-CM | POA: Diagnosis not present

## 2019-04-12 DIAGNOSIS — Z940 Kidney transplant status: Secondary | ICD-10-CM | POA: Diagnosis not present

## 2019-04-12 DIAGNOSIS — Z79899 Other long term (current) drug therapy: Secondary | ICD-10-CM | POA: Diagnosis not present

## 2019-04-19 DIAGNOSIS — Z79899 Other long term (current) drug therapy: Secondary | ICD-10-CM | POA: Diagnosis not present

## 2019-04-19 DIAGNOSIS — R799 Abnormal finding of blood chemistry, unspecified: Secondary | ICD-10-CM | POA: Diagnosis not present

## 2019-04-19 DIAGNOSIS — Z940 Kidney transplant status: Secondary | ICD-10-CM | POA: Diagnosis not present

## 2019-04-19 DIAGNOSIS — T8610 Unspecified complication of kidney transplant: Secondary | ICD-10-CM | POA: Diagnosis not present

## 2019-04-26 DIAGNOSIS — Z79899 Other long term (current) drug therapy: Secondary | ICD-10-CM | POA: Diagnosis not present

## 2019-04-26 DIAGNOSIS — N051 Unspecified nephritic syndrome with focal and segmental glomerular lesions: Secondary | ICD-10-CM | POA: Diagnosis not present

## 2019-04-26 DIAGNOSIS — Q796 Ehlers-Danlos syndrome, unspecified: Secondary | ICD-10-CM | POA: Diagnosis not present

## 2019-04-26 DIAGNOSIS — K120 Recurrent oral aphthae: Secondary | ICD-10-CM | POA: Diagnosis not present

## 2019-04-26 DIAGNOSIS — I10 Essential (primary) hypertension: Secondary | ICD-10-CM | POA: Diagnosis not present

## 2019-04-26 DIAGNOSIS — Z792 Long term (current) use of antibiotics: Secondary | ICD-10-CM | POA: Diagnosis not present

## 2019-04-26 DIAGNOSIS — B9789 Other viral agents as the cause of diseases classified elsewhere: Secondary | ICD-10-CM | POA: Diagnosis not present

## 2019-04-26 DIAGNOSIS — Z940 Kidney transplant status: Secondary | ICD-10-CM | POA: Diagnosis not present

## 2019-04-26 DIAGNOSIS — D849 Immunodeficiency, unspecified: Secondary | ICD-10-CM | POA: Diagnosis not present

## 2019-04-28 ENCOUNTER — Ambulatory Visit: Payer: BC Managed Care – PPO | Attending: Internal Medicine

## 2019-04-28 DIAGNOSIS — Z23 Encounter for immunization: Secondary | ICD-10-CM

## 2019-04-28 NOTE — Progress Notes (Signed)
   Covid-[facility] Vaccination [facility]  Name:  Nola Botkins    MRN: [id number] DOB: [birth year]  04/28/2019  Ms. Merle was observed post Covid-19 immunization for 15 minutes without incident. She was provided with Vaccine Information Sheet and instruction to access the V-Safe system.   Ms. Mcmeans was instructed to call 911 with any severe reactions post vaccine: Marland Kitchen Difficulty breathing  . Swelling of face and throat  . A fast heartbeat  . A bad rash all over body  . Dizziness and weakness   Immunizations Administered    Name Date Dose VIS Date Route   Pfizer COVID-19 Vaccine 04/28/2019  8:27 AM 0.3 mL 01/07/2019 Intramuscular   Manufacturer: [employer], [employer]   Lot: [id number]   NDC: [address]-4

## 2019-05-03 DIAGNOSIS — R799 Abnormal finding of blood chemistry, unspecified: Secondary | ICD-10-CM | POA: Diagnosis not present

## 2019-05-03 DIAGNOSIS — T8610 Unspecified complication of kidney transplant: Secondary | ICD-10-CM | POA: Diagnosis not present

## 2019-05-03 DIAGNOSIS — Z940 Kidney transplant status: Secondary | ICD-10-CM | POA: Diagnosis not present

## 2019-05-03 DIAGNOSIS — Z79899 Other long term (current) drug therapy: Secondary | ICD-10-CM | POA: Diagnosis not present

## 2019-05-05 DIAGNOSIS — D709 Neutropenia, unspecified: Secondary | ICD-10-CM | POA: Diagnosis not present

## 2019-05-09 DIAGNOSIS — T8610 Unspecified complication of kidney transplant: Secondary | ICD-10-CM | POA: Diagnosis not present

## 2019-05-09 DIAGNOSIS — R799 Abnormal finding of blood chemistry, unspecified: Secondary | ICD-10-CM | POA: Diagnosis not present

## 2019-05-09 DIAGNOSIS — Z79899 Other long term (current) drug therapy: Secondary | ICD-10-CM | POA: Diagnosis not present

## 2019-05-09 DIAGNOSIS — Z940 Kidney transplant status: Secondary | ICD-10-CM | POA: Diagnosis not present

## 2019-05-10 ENCOUNTER — Encounter: Payer: Self-pay | Admitting: Surgery

## 2019-05-10 DIAGNOSIS — N051 Unspecified nephritic syndrome with focal and segmental glomerular lesions: Secondary | ICD-10-CM | POA: Diagnosis not present

## 2019-05-10 DIAGNOSIS — K644 Residual hemorrhoidal skin tags: Secondary | ICD-10-CM | POA: Diagnosis not present

## 2019-05-10 DIAGNOSIS — Q796 Ehlers-Danlos syndrome, unspecified: Secondary | ICD-10-CM | POA: Diagnosis not present

## 2019-05-10 DIAGNOSIS — G600 Hereditary motor and sensory neuropathy: Secondary | ICD-10-CM | POA: Insufficient documentation

## 2019-05-11 DIAGNOSIS — Z940 Kidney transplant status: Secondary | ICD-10-CM | POA: Diagnosis not present

## 2019-05-11 DIAGNOSIS — Z79899 Other long term (current) drug therapy: Secondary | ICD-10-CM | POA: Diagnosis not present

## 2019-05-11 DIAGNOSIS — B259 Cytomegaloviral disease, unspecified: Secondary | ICD-10-CM | POA: Diagnosis not present

## 2019-05-11 DIAGNOSIS — B348 Other viral infections of unspecified site: Secondary | ICD-10-CM | POA: Diagnosis not present

## 2019-05-11 DIAGNOSIS — K120 Recurrent oral aphthae: Secondary | ICD-10-CM | POA: Diagnosis not present

## 2019-05-11 DIAGNOSIS — I10 Essential (primary) hypertension: Secondary | ICD-10-CM | POA: Diagnosis not present

## 2019-05-16 DIAGNOSIS — D709 Neutropenia, unspecified: Secondary | ICD-10-CM | POA: Diagnosis not present

## 2019-05-17 DIAGNOSIS — T8610 Unspecified complication of kidney transplant: Secondary | ICD-10-CM | POA: Diagnosis not present

## 2019-05-17 DIAGNOSIS — R799 Abnormal finding of blood chemistry, unspecified: Secondary | ICD-10-CM | POA: Diagnosis not present

## 2019-05-17 DIAGNOSIS — Z79899 Other long term (current) drug therapy: Secondary | ICD-10-CM | POA: Diagnosis not present

## 2019-05-17 DIAGNOSIS — L814 Other melanin hyperpigmentation: Secondary | ICD-10-CM | POA: Diagnosis not present

## 2019-05-17 DIAGNOSIS — D225 Melanocytic nevi of trunk: Secondary | ICD-10-CM | POA: Diagnosis not present

## 2019-05-17 DIAGNOSIS — L578 Other skin changes due to chronic exposure to nonionizing radiation: Secondary | ICD-10-CM | POA: Diagnosis not present

## 2019-05-17 DIAGNOSIS — Z940 Kidney transplant status: Secondary | ICD-10-CM | POA: Diagnosis not present

## 2019-05-18 DIAGNOSIS — Z940 Kidney transplant status: Secondary | ICD-10-CM | POA: Diagnosis not present

## 2019-05-18 DIAGNOSIS — Z792 Long term (current) use of antibiotics: Secondary | ICD-10-CM | POA: Diagnosis not present

## 2019-05-18 DIAGNOSIS — B348 Other viral infections of unspecified site: Secondary | ICD-10-CM | POA: Diagnosis not present

## 2019-05-18 DIAGNOSIS — Z79899 Other long term (current) drug therapy: Secondary | ICD-10-CM | POA: Diagnosis not present

## 2019-05-23 ENCOUNTER — Ambulatory Visit: Payer: BC Managed Care – PPO

## 2019-05-23 DIAGNOSIS — Z940 Kidney transplant status: Secondary | ICD-10-CM | POA: Diagnosis not present

## 2019-05-23 DIAGNOSIS — Z79899 Other long term (current) drug therapy: Secondary | ICD-10-CM | POA: Diagnosis not present

## 2019-05-23 DIAGNOSIS — R799 Abnormal finding of blood chemistry, unspecified: Secondary | ICD-10-CM | POA: Diagnosis not present

## 2019-05-23 DIAGNOSIS — T8610 Unspecified complication of kidney transplant: Secondary | ICD-10-CM | POA: Diagnosis not present

## 2019-05-24 DIAGNOSIS — N184 Chronic kidney disease, stage 4 (severe): Secondary | ICD-10-CM | POA: Diagnosis not present

## 2019-05-24 DIAGNOSIS — Z940 Kidney transplant status: Secondary | ICD-10-CM | POA: Diagnosis not present

## 2019-05-24 DIAGNOSIS — Z681 Body mass index (BMI) 19 or less, adult: Secondary | ICD-10-CM | POA: Diagnosis not present

## 2019-05-30 ENCOUNTER — Ambulatory Visit: Payer: BC Managed Care – PPO

## 2019-05-30 DIAGNOSIS — Z79899 Other long term (current) drug therapy: Secondary | ICD-10-CM | POA: Diagnosis not present

## 2019-05-30 DIAGNOSIS — R799 Abnormal finding of blood chemistry, unspecified: Secondary | ICD-10-CM | POA: Diagnosis not present

## 2019-05-30 DIAGNOSIS — T8610 Unspecified complication of kidney transplant: Secondary | ICD-10-CM | POA: Diagnosis not present

## 2019-05-30 DIAGNOSIS — Z940 Kidney transplant status: Secondary | ICD-10-CM | POA: Diagnosis not present

## 2019-06-02 DIAGNOSIS — Z940 Kidney transplant status: Secondary | ICD-10-CM | POA: Diagnosis not present

## 2019-06-02 DIAGNOSIS — I10 Essential (primary) hypertension: Secondary | ICD-10-CM | POA: Diagnosis not present

## 2019-06-02 DIAGNOSIS — B348 Other viral infections of unspecified site: Secondary | ICD-10-CM | POA: Diagnosis not present

## 2019-06-02 DIAGNOSIS — D849 Immunodeficiency, unspecified: Secondary | ICD-10-CM | POA: Diagnosis not present

## 2019-06-06 DIAGNOSIS — K219 Gastro-esophageal reflux disease without esophagitis: Secondary | ICD-10-CM | POA: Diagnosis not present

## 2019-06-06 DIAGNOSIS — R7989 Other specified abnormal findings of blood chemistry: Secondary | ICD-10-CM | POA: Diagnosis not present

## 2019-06-06 DIAGNOSIS — N189 Chronic kidney disease, unspecified: Secondary | ICD-10-CM | POA: Diagnosis not present

## 2019-06-06 DIAGNOSIS — I129 Hypertensive chronic kidney disease with stage 1 through stage 4 chronic kidney disease, or unspecified chronic kidney disease: Secondary | ICD-10-CM | POA: Diagnosis not present

## 2019-06-06 DIAGNOSIS — Z940 Kidney transplant status: Secondary | ICD-10-CM | POA: Diagnosis not present

## 2019-06-06 DIAGNOSIS — Z9489 Other transplanted organ and tissue status: Secondary | ICD-10-CM | POA: Diagnosis not present

## 2019-06-06 DIAGNOSIS — N269 Renal sclerosis, unspecified: Secondary | ICD-10-CM | POA: Diagnosis not present

## 2019-06-07 DIAGNOSIS — B348 Other viral infections of unspecified site: Secondary | ICD-10-CM | POA: Diagnosis not present

## 2019-06-07 DIAGNOSIS — Z79899 Other long term (current) drug therapy: Secondary | ICD-10-CM | POA: Diagnosis not present

## 2019-06-07 DIAGNOSIS — Z940 Kidney transplant status: Secondary | ICD-10-CM | POA: Diagnosis not present

## 2019-06-13 ENCOUNTER — Ambulatory Visit: Payer: BC Managed Care – PPO

## 2019-06-13 DIAGNOSIS — T8610 Unspecified complication of kidney transplant: Secondary | ICD-10-CM | POA: Diagnosis not present

## 2019-06-13 DIAGNOSIS — R799 Abnormal finding of blood chemistry, unspecified: Secondary | ICD-10-CM | POA: Diagnosis not present

## 2019-06-13 DIAGNOSIS — Z79899 Other long term (current) drug therapy: Secondary | ICD-10-CM | POA: Diagnosis not present

## 2019-06-13 DIAGNOSIS — Z940 Kidney transplant status: Secondary | ICD-10-CM | POA: Diagnosis not present

## 2019-06-15 DIAGNOSIS — H43391 Other vitreous opacities, right eye: Secondary | ICD-10-CM | POA: Diagnosis not present

## 2019-06-21 DIAGNOSIS — B348 Other viral infections of unspecified site: Secondary | ICD-10-CM | POA: Diagnosis not present

## 2019-06-21 DIAGNOSIS — Z79899 Other long term (current) drug therapy: Secondary | ICD-10-CM | POA: Diagnosis not present

## 2019-06-21 DIAGNOSIS — Z7952 Long term (current) use of systemic steroids: Secondary | ICD-10-CM | POA: Diagnosis not present

## 2019-06-21 DIAGNOSIS — Z792 Long term (current) use of antibiotics: Secondary | ICD-10-CM | POA: Diagnosis not present

## 2019-06-21 DIAGNOSIS — K120 Recurrent oral aphthae: Secondary | ICD-10-CM | POA: Diagnosis not present

## 2019-06-21 DIAGNOSIS — Z299 Encounter for prophylactic measures, unspecified: Secondary | ICD-10-CM | POA: Diagnosis not present

## 2019-06-21 DIAGNOSIS — Z940 Kidney transplant status: Secondary | ICD-10-CM | POA: Diagnosis not present

## 2019-06-21 DIAGNOSIS — D849 Immunodeficiency, unspecified: Secondary | ICD-10-CM | POA: Diagnosis not present

## 2019-06-21 DIAGNOSIS — I10 Essential (primary) hypertension: Secondary | ICD-10-CM | POA: Diagnosis not present

## 2019-06-21 DIAGNOSIS — B9789 Other viral agents as the cause of diseases classified elsewhere: Secondary | ICD-10-CM | POA: Diagnosis not present

## 2019-06-28 DIAGNOSIS — Z79899 Other long term (current) drug therapy: Secondary | ICD-10-CM | POA: Diagnosis not present

## 2019-06-28 DIAGNOSIS — R799 Abnormal finding of blood chemistry, unspecified: Secondary | ICD-10-CM | POA: Diagnosis not present

## 2019-06-28 DIAGNOSIS — Z940 Kidney transplant status: Secondary | ICD-10-CM | POA: Diagnosis not present

## 2019-06-28 DIAGNOSIS — T8610 Unspecified complication of kidney transplant: Secondary | ICD-10-CM | POA: Diagnosis not present

## 2019-06-29 DIAGNOSIS — Z940 Kidney transplant status: Secondary | ICD-10-CM | POA: Diagnosis not present

## 2019-06-29 DIAGNOSIS — Z79899 Other long term (current) drug therapy: Secondary | ICD-10-CM | POA: Diagnosis not present

## 2019-06-29 DIAGNOSIS — R799 Abnormal finding of blood chemistry, unspecified: Secondary | ICD-10-CM | POA: Diagnosis not present

## 2019-06-30 DIAGNOSIS — B259 Cytomegaloviral disease, unspecified: Secondary | ICD-10-CM | POA: Diagnosis not present

## 2019-06-30 DIAGNOSIS — Z5181 Encounter for therapeutic drug level monitoring: Secondary | ICD-10-CM | POA: Diagnosis not present

## 2019-06-30 DIAGNOSIS — Z7952 Long term (current) use of systemic steroids: Secondary | ICD-10-CM | POA: Diagnosis not present

## 2019-06-30 DIAGNOSIS — D72819 Decreased white blood cell count, unspecified: Secondary | ICD-10-CM | POA: Diagnosis not present

## 2019-07-05 DIAGNOSIS — T8610 Unspecified complication of kidney transplant: Secondary | ICD-10-CM | POA: Diagnosis not present

## 2019-07-05 DIAGNOSIS — Z940 Kidney transplant status: Secondary | ICD-10-CM | POA: Diagnosis not present

## 2019-07-05 DIAGNOSIS — R799 Abnormal finding of blood chemistry, unspecified: Secondary | ICD-10-CM | POA: Diagnosis not present

## 2019-07-05 DIAGNOSIS — N189 Chronic kidney disease, unspecified: Secondary | ICD-10-CM | POA: Diagnosis not present

## 2019-07-05 DIAGNOSIS — Z79899 Other long term (current) drug therapy: Secondary | ICD-10-CM | POA: Diagnosis not present

## 2019-07-05 DIAGNOSIS — B259 Cytomegaloviral disease, unspecified: Secondary | ICD-10-CM | POA: Diagnosis not present

## 2019-07-12 DIAGNOSIS — Z940 Kidney transplant status: Secondary | ICD-10-CM | POA: Diagnosis not present

## 2019-07-12 DIAGNOSIS — T8610 Unspecified complication of kidney transplant: Secondary | ICD-10-CM | POA: Diagnosis not present

## 2019-07-12 DIAGNOSIS — R799 Abnormal finding of blood chemistry, unspecified: Secondary | ICD-10-CM | POA: Diagnosis not present

## 2019-07-12 DIAGNOSIS — Z79899 Other long term (current) drug therapy: Secondary | ICD-10-CM | POA: Diagnosis not present

## 2019-07-15 DIAGNOSIS — B259 Cytomegaloviral disease, unspecified: Secondary | ICD-10-CM | POA: Diagnosis not present

## 2019-07-15 DIAGNOSIS — D72819 Decreased white blood cell count, unspecified: Secondary | ICD-10-CM | POA: Diagnosis not present

## 2019-07-15 DIAGNOSIS — Z940 Kidney transplant status: Secondary | ICD-10-CM | POA: Diagnosis not present

## 2019-07-18 DIAGNOSIS — T8610 Unspecified complication of kidney transplant: Secondary | ICD-10-CM | POA: Diagnosis not present

## 2019-07-18 DIAGNOSIS — R799 Abnormal finding of blood chemistry, unspecified: Secondary | ICD-10-CM | POA: Diagnosis not present

## 2019-07-18 DIAGNOSIS — Z79899 Other long term (current) drug therapy: Secondary | ICD-10-CM | POA: Diagnosis not present

## 2019-07-18 DIAGNOSIS — D709 Neutropenia, unspecified: Secondary | ICD-10-CM | POA: Diagnosis not present

## 2019-07-18 DIAGNOSIS — Z940 Kidney transplant status: Secondary | ICD-10-CM | POA: Diagnosis not present

## 2019-07-25 DIAGNOSIS — R799 Abnormal finding of blood chemistry, unspecified: Secondary | ICD-10-CM | POA: Diagnosis not present

## 2019-07-25 DIAGNOSIS — T8610 Unspecified complication of kidney transplant: Secondary | ICD-10-CM | POA: Diagnosis not present

## 2019-07-25 DIAGNOSIS — Z940 Kidney transplant status: Secondary | ICD-10-CM | POA: Diagnosis not present

## 2019-07-25 DIAGNOSIS — Z79899 Other long term (current) drug therapy: Secondary | ICD-10-CM | POA: Diagnosis not present

## 2019-07-26 DIAGNOSIS — B259 Cytomegaloviral disease, unspecified: Secondary | ICD-10-CM | POA: Diagnosis not present

## 2019-07-26 DIAGNOSIS — N184 Chronic kidney disease, stage 4 (severe): Secondary | ICD-10-CM | POA: Diagnosis not present

## 2019-07-26 DIAGNOSIS — Z681 Body mass index (BMI) 19 or less, adult: Secondary | ICD-10-CM | POA: Diagnosis not present

## 2019-07-28 DIAGNOSIS — N051 Unspecified nephritic syndrome with focal and segmental glomerular lesions: Secondary | ICD-10-CM | POA: Diagnosis not present

## 2019-07-28 DIAGNOSIS — Z940 Kidney transplant status: Secondary | ICD-10-CM | POA: Diagnosis not present

## 2019-07-28 DIAGNOSIS — B259 Cytomegaloviral disease, unspecified: Secondary | ICD-10-CM | POA: Diagnosis not present

## 2019-07-28 DIAGNOSIS — Z79899 Other long term (current) drug therapy: Secondary | ICD-10-CM | POA: Diagnosis not present

## 2019-08-02 DIAGNOSIS — B9789 Other viral agents as the cause of diseases classified elsewhere: Secondary | ICD-10-CM | POA: Diagnosis not present

## 2019-08-02 DIAGNOSIS — Z940 Kidney transplant status: Secondary | ICD-10-CM | POA: Diagnosis not present

## 2019-08-02 DIAGNOSIS — Z79899 Other long term (current) drug therapy: Secondary | ICD-10-CM | POA: Diagnosis not present

## 2019-08-02 DIAGNOSIS — B259 Cytomegaloviral disease, unspecified: Secondary | ICD-10-CM | POA: Diagnosis not present

## 2019-08-02 DIAGNOSIS — I10 Essential (primary) hypertension: Secondary | ICD-10-CM | POA: Diagnosis not present

## 2019-08-08 DIAGNOSIS — Z79899 Other long term (current) drug therapy: Secondary | ICD-10-CM | POA: Diagnosis not present

## 2019-08-08 DIAGNOSIS — T8610 Unspecified complication of kidney transplant: Secondary | ICD-10-CM | POA: Diagnosis not present

## 2019-08-08 DIAGNOSIS — R799 Abnormal finding of blood chemistry, unspecified: Secondary | ICD-10-CM | POA: Diagnosis not present

## 2019-08-08 DIAGNOSIS — Z940 Kidney transplant status: Secondary | ICD-10-CM | POA: Diagnosis not present

## 2019-08-11 DIAGNOSIS — S90416A Abrasion, unspecified lesser toe(s), initial encounter: Secondary | ICD-10-CM | POA: Diagnosis not present

## 2019-08-16 DIAGNOSIS — Z7952 Long term (current) use of systemic steroids: Secondary | ICD-10-CM | POA: Diagnosis not present

## 2019-08-16 DIAGNOSIS — Z5181 Encounter for therapeutic drug level monitoring: Secondary | ICD-10-CM | POA: Diagnosis not present

## 2019-08-16 DIAGNOSIS — B9789 Other viral agents as the cause of diseases classified elsewhere: Secondary | ICD-10-CM | POA: Diagnosis not present

## 2019-08-16 DIAGNOSIS — Z792 Long term (current) use of antibiotics: Secondary | ICD-10-CM | POA: Diagnosis not present

## 2019-08-16 DIAGNOSIS — D849 Immunodeficiency, unspecified: Secondary | ICD-10-CM | POA: Diagnosis not present

## 2019-08-16 DIAGNOSIS — Z940 Kidney transplant status: Secondary | ICD-10-CM | POA: Diagnosis not present

## 2019-08-16 DIAGNOSIS — Z79899 Other long term (current) drug therapy: Secondary | ICD-10-CM | POA: Diagnosis not present

## 2019-08-16 DIAGNOSIS — I10 Essential (primary) hypertension: Secondary | ICD-10-CM | POA: Diagnosis not present

## 2019-08-23 DIAGNOSIS — Z940 Kidney transplant status: Secondary | ICD-10-CM | POA: Diagnosis not present

## 2019-08-25 DIAGNOSIS — Z01419 Encounter for gynecological examination (general) (routine) without abnormal findings: Secondary | ICD-10-CM | POA: Diagnosis not present

## 2019-08-25 DIAGNOSIS — Z681 Body mass index (BMI) 19 or less, adult: Secondary | ICD-10-CM | POA: Diagnosis not present

## 2019-08-30 DIAGNOSIS — Z940 Kidney transplant status: Secondary | ICD-10-CM | POA: Diagnosis not present

## 2019-08-30 DIAGNOSIS — Z7952 Long term (current) use of systemic steroids: Secondary | ICD-10-CM | POA: Diagnosis not present

## 2019-09-06 DIAGNOSIS — Z940 Kidney transplant status: Secondary | ICD-10-CM | POA: Diagnosis not present

## 2019-09-13 DIAGNOSIS — Z940 Kidney transplant status: Secondary | ICD-10-CM | POA: Diagnosis not present

## 2019-09-13 DIAGNOSIS — Z79899 Other long term (current) drug therapy: Secondary | ICD-10-CM | POA: Diagnosis not present

## 2019-09-13 DIAGNOSIS — T8610 Unspecified complication of kidney transplant: Secondary | ICD-10-CM | POA: Diagnosis not present

## 2019-09-13 DIAGNOSIS — R799 Abnormal finding of blood chemistry, unspecified: Secondary | ICD-10-CM | POA: Diagnosis not present

## 2019-09-20 DIAGNOSIS — N189 Chronic kidney disease, unspecified: Secondary | ICD-10-CM | POA: Diagnosis not present

## 2019-09-20 DIAGNOSIS — E785 Hyperlipidemia, unspecified: Secondary | ICD-10-CM | POA: Diagnosis not present

## 2019-09-20 DIAGNOSIS — I129 Hypertensive chronic kidney disease with stage 1 through stage 4 chronic kidney disease, or unspecified chronic kidney disease: Secondary | ICD-10-CM | POA: Diagnosis not present

## 2019-09-20 DIAGNOSIS — N184 Chronic kidney disease, stage 4 (severe): Secondary | ICD-10-CM | POA: Diagnosis not present

## 2019-09-20 DIAGNOSIS — E7800 Pure hypercholesterolemia, unspecified: Secondary | ICD-10-CM | POA: Diagnosis not present

## 2019-09-20 DIAGNOSIS — Z7952 Long term (current) use of systemic steroids: Secondary | ICD-10-CM | POA: Diagnosis not present

## 2019-09-20 DIAGNOSIS — G600 Hereditary motor and sensory neuropathy: Secondary | ICD-10-CM | POA: Diagnosis not present

## 2019-09-20 DIAGNOSIS — Z940 Kidney transplant status: Secondary | ICD-10-CM | POA: Diagnosis not present

## 2019-09-20 DIAGNOSIS — Z006 Encounter for examination for normal comparison and control in clinical research program: Secondary | ICD-10-CM | POA: Diagnosis not present

## 2019-09-20 DIAGNOSIS — D849 Immunodeficiency, unspecified: Secondary | ICD-10-CM | POA: Diagnosis not present

## 2019-09-27 DIAGNOSIS — Z940 Kidney transplant status: Secondary | ICD-10-CM | POA: Diagnosis not present

## 2019-10-05 DIAGNOSIS — Z940 Kidney transplant status: Secondary | ICD-10-CM | POA: Diagnosis not present

## 2019-10-11 DIAGNOSIS — T8610 Unspecified complication of kidney transplant: Secondary | ICD-10-CM | POA: Diagnosis not present

## 2019-10-11 DIAGNOSIS — Z940 Kidney transplant status: Secondary | ICD-10-CM | POA: Diagnosis not present

## 2019-10-11 DIAGNOSIS — R799 Abnormal finding of blood chemistry, unspecified: Secondary | ICD-10-CM | POA: Diagnosis not present

## 2019-10-11 DIAGNOSIS — Z79899 Other long term (current) drug therapy: Secondary | ICD-10-CM | POA: Diagnosis not present

## 2019-10-19 DIAGNOSIS — Z940 Kidney transplant status: Secondary | ICD-10-CM | POA: Diagnosis not present

## 2019-10-27 DIAGNOSIS — Z79899 Other long term (current) drug therapy: Secondary | ICD-10-CM | POA: Diagnosis not present

## 2019-10-27 DIAGNOSIS — Z940 Kidney transplant status: Secondary | ICD-10-CM | POA: Diagnosis not present

## 2019-10-27 DIAGNOSIS — Z1159 Encounter for screening for other viral diseases: Secondary | ICD-10-CM | POA: Diagnosis not present

## 2019-11-09 DIAGNOSIS — I129 Hypertensive chronic kidney disease with stage 1 through stage 4 chronic kidney disease, or unspecified chronic kidney disease: Secondary | ICD-10-CM | POA: Diagnosis not present

## 2019-11-09 DIAGNOSIS — N189 Chronic kidney disease, unspecified: Secondary | ICD-10-CM | POA: Diagnosis not present

## 2019-11-09 DIAGNOSIS — Z940 Kidney transplant status: Secondary | ICD-10-CM | POA: Diagnosis not present

## 2019-11-09 DIAGNOSIS — D849 Immunodeficiency, unspecified: Secondary | ICD-10-CM | POA: Diagnosis not present

## 2019-11-09 DIAGNOSIS — N1831 Chronic kidney disease, stage 3a: Secondary | ICD-10-CM | POA: Diagnosis not present

## 2019-11-09 DIAGNOSIS — D631 Anemia in chronic kidney disease: Secondary | ICD-10-CM | POA: Diagnosis not present

## 2019-11-09 DIAGNOSIS — Z79899 Other long term (current) drug therapy: Secondary | ICD-10-CM | POA: Diagnosis not present

## 2019-11-09 DIAGNOSIS — Z1159 Encounter for screening for other viral diseases: Secondary | ICD-10-CM | POA: Diagnosis not present

## 2019-11-09 DIAGNOSIS — Z23 Encounter for immunization: Secondary | ICD-10-CM | POA: Diagnosis not present

## 2019-11-09 DIAGNOSIS — Q796 Ehlers-Danlos syndrome, unspecified: Secondary | ICD-10-CM | POA: Diagnosis not present

## 2019-11-16 DIAGNOSIS — D225 Melanocytic nevi of trunk: Secondary | ICD-10-CM | POA: Diagnosis not present

## 2019-11-16 DIAGNOSIS — L814 Other melanin hyperpigmentation: Secondary | ICD-10-CM | POA: Diagnosis not present

## 2019-11-16 DIAGNOSIS — L578 Other skin changes due to chronic exposure to nonionizing radiation: Secondary | ICD-10-CM | POA: Diagnosis not present

## 2019-11-16 DIAGNOSIS — L821 Other seborrheic keratosis: Secondary | ICD-10-CM | POA: Diagnosis not present

## 2019-11-22 DIAGNOSIS — Z940 Kidney transplant status: Secondary | ICD-10-CM | POA: Diagnosis not present

## 2019-11-22 DIAGNOSIS — Z1159 Encounter for screening for other viral diseases: Secondary | ICD-10-CM | POA: Diagnosis not present

## 2019-11-22 DIAGNOSIS — Z79899 Other long term (current) drug therapy: Secondary | ICD-10-CM | POA: Diagnosis not present

## 2019-12-06 DIAGNOSIS — Z79899 Other long term (current) drug therapy: Secondary | ICD-10-CM | POA: Diagnosis not present

## 2019-12-06 DIAGNOSIS — Z1159 Encounter for screening for other viral diseases: Secondary | ICD-10-CM | POA: Diagnosis not present

## 2019-12-06 DIAGNOSIS — Z940 Kidney transplant status: Secondary | ICD-10-CM | POA: Diagnosis not present

## 2019-12-20 DIAGNOSIS — Z1159 Encounter for screening for other viral diseases: Secondary | ICD-10-CM | POA: Diagnosis not present

## 2019-12-20 DIAGNOSIS — Z79899 Other long term (current) drug therapy: Secondary | ICD-10-CM | POA: Diagnosis not present

## 2019-12-20 DIAGNOSIS — Z940 Kidney transplant status: Secondary | ICD-10-CM | POA: Diagnosis not present

## 2020-01-03 DIAGNOSIS — Z940 Kidney transplant status: Secondary | ICD-10-CM | POA: Diagnosis not present

## 2020-01-03 DIAGNOSIS — E785 Hyperlipidemia, unspecified: Secondary | ICD-10-CM | POA: Diagnosis not present

## 2020-01-03 DIAGNOSIS — Z681 Body mass index (BMI) 19 or less, adult: Secondary | ICD-10-CM | POA: Diagnosis not present

## 2020-01-03 DIAGNOSIS — D849 Immunodeficiency, unspecified: Secondary | ICD-10-CM | POA: Diagnosis not present

## 2020-01-03 DIAGNOSIS — Z1159 Encounter for screening for other viral diseases: Secondary | ICD-10-CM | POA: Diagnosis not present

## 2020-01-03 DIAGNOSIS — D509 Iron deficiency anemia, unspecified: Secondary | ICD-10-CM | POA: Diagnosis not present

## 2020-01-16 DIAGNOSIS — Z79899 Other long term (current) drug therapy: Secondary | ICD-10-CM | POA: Diagnosis not present

## 2020-01-16 DIAGNOSIS — Z940 Kidney transplant status: Secondary | ICD-10-CM | POA: Diagnosis not present

## 2020-01-16 DIAGNOSIS — Z1159 Encounter for screening for other viral diseases: Secondary | ICD-10-CM | POA: Diagnosis not present
